# Patient Record
Sex: Male | Born: 1975
Health system: Southern US, Community
[De-identification: ages and names within clinical notes are randomized; demographics above are authoritative.]

## PROBLEM LIST (undated history)

## (undated) DIAGNOSIS — C3490 Malignant neoplasm of unspecified part of unspecified bronchus or lung: Secondary | ICD-10-CM

## (undated) DIAGNOSIS — IMO0001 Reserved for inherently not codable concepts without codable children: Secondary | ICD-10-CM

## (undated) DIAGNOSIS — M5441 Lumbago with sciatica, right side: Secondary | ICD-10-CM

## (undated) DIAGNOSIS — M5126 Other intervertebral disc displacement, lumbar region: Secondary | ICD-10-CM

## (undated) DIAGNOSIS — IMO0002 Reserved for concepts with insufficient information to code with codable children: Secondary | ICD-10-CM

## (undated) HISTORY — DX: Malignant neoplasm of unspecified part of unspecified bronchus or lung: C34.90

## (undated) HISTORY — DX: Reserved for concepts with insufficient information to code with codable children: IMO0002

## (undated) HISTORY — DX: Reserved for inherently not codable concepts without codable children: IMO0001

---

## 2005-05-14 HISTORY — PX: VASECTOMY: SHX75

## 2015-10-09 ENCOUNTER — Inpatient Hospital Stay (HOSPITAL_COMMUNITY)
Admission: EM | Admit: 2015-10-09 | Discharge: 2015-10-17 | DRG: 477 | Disposition: A | Discharge status: Home Health | Payer: BC Managed Care – PPO | Attending: Internal Medicine | Admitting: Internal Medicine

## 2015-10-09 ENCOUNTER — Encounter (HOSPITAL_COMMUNITY): Payer: Self-pay | Admitting: Emergency Medicine

## 2015-10-09 ENCOUNTER — Emergency Department (HOSPITAL_COMMUNITY): Payer: BC Managed Care – PPO

## 2015-10-09 DIAGNOSIS — M8448XA Pathological fracture, other site, initial encounter for fracture: Secondary | ICD-10-CM | POA: Diagnosis present

## 2015-10-09 DIAGNOSIS — Z801 Family history of malignant neoplasm of trachea, bronchus and lung: Secondary | ICD-10-CM

## 2015-10-09 DIAGNOSIS — M4327 Fusion of spine, lumbosacral region: Secondary | ICD-10-CM | POA: Diagnosis present

## 2015-10-09 DIAGNOSIS — C801 Malignant (primary) neoplasm, unspecified: Secondary | ICD-10-CM | POA: Insufficient documentation

## 2015-10-09 DIAGNOSIS — R918 Other nonspecific abnormal finding of lung field: Secondary | ICD-10-CM | POA: Diagnosis not present

## 2015-10-09 DIAGNOSIS — C7951 Secondary malignant neoplasm of bone: Secondary | ICD-10-CM | POA: Diagnosis not present

## 2015-10-09 DIAGNOSIS — M899 Disorder of bone, unspecified: Secondary | ICD-10-CM

## 2015-10-09 DIAGNOSIS — Z87891 Personal history of nicotine dependence: Secondary | ICD-10-CM

## 2015-10-09 DIAGNOSIS — IMO0002 Reserved for concepts with insufficient information to code with codable children: Secondary | ICD-10-CM

## 2015-10-09 DIAGNOSIS — M549 Dorsalgia, unspecified: Secondary | ICD-10-CM | POA: Diagnosis present

## 2015-10-09 DIAGNOSIS — C3490 Malignant neoplasm of unspecified part of unspecified bronchus or lung: Secondary | ICD-10-CM | POA: Diagnosis present

## 2015-10-09 DIAGNOSIS — R52 Pain, unspecified: Secondary | ICD-10-CM

## 2015-10-09 DIAGNOSIS — C7949 Secondary malignant neoplasm of other parts of nervous system: Secondary | ICD-10-CM | POA: Insufficient documentation

## 2015-10-09 DIAGNOSIS — M25552 Pain in left hip: Secondary | ICD-10-CM | POA: Diagnosis not present

## 2015-10-09 DIAGNOSIS — Z807 Family history of other malignant neoplasms of lymphoid, hematopoietic and related tissues: Secondary | ICD-10-CM

## 2015-10-09 DIAGNOSIS — M5146 Schmorl's nodes, lumbar region: Secondary | ICD-10-CM | POA: Diagnosis present

## 2015-10-09 DIAGNOSIS — M5441 Lumbago with sciatica, right side: Secondary | ICD-10-CM | POA: Diagnosis present

## 2015-10-09 DIAGNOSIS — D72829 Elevated white blood cell count, unspecified: Secondary | ICD-10-CM | POA: Diagnosis present

## 2015-10-09 DIAGNOSIS — I2699 Other pulmonary embolism without acute cor pulmonale: Secondary | ICD-10-CM | POA: Insufficient documentation

## 2015-10-09 DIAGNOSIS — D492 Neoplasm of unspecified behavior of bone, soft tissue, and skin: Secondary | ICD-10-CM | POA: Diagnosis present

## 2015-10-09 DIAGNOSIS — S32000A Wedge compression fracture of unspecified lumbar vertebra, initial encounter for closed fracture: Secondary | ICD-10-CM | POA: Insufficient documentation

## 2015-10-09 DIAGNOSIS — M5126 Other intervertebral disc displacement, lumbar region: Secondary | ICD-10-CM | POA: Diagnosis present

## 2015-10-09 DIAGNOSIS — C7800 Secondary malignant neoplasm of unspecified lung: Secondary | ICD-10-CM | POA: Diagnosis present

## 2015-10-09 DIAGNOSIS — K5900 Constipation, unspecified: Secondary | ICD-10-CM | POA: Diagnosis not present

## 2015-10-09 DIAGNOSIS — I82401 Acute embolism and thrombosis of unspecified deep veins of right lower extremity: Secondary | ICD-10-CM | POA: Diagnosis not present

## 2015-10-09 DIAGNOSIS — K5909 Other constipation: Secondary | ICD-10-CM

## 2015-10-09 DIAGNOSIS — Z880 Allergy status to penicillin: Secondary | ICD-10-CM

## 2015-10-09 DIAGNOSIS — J189 Pneumonia, unspecified organism: Secondary | ICD-10-CM | POA: Diagnosis present

## 2015-10-09 DIAGNOSIS — R229 Localized swelling, mass and lump, unspecified: Secondary | ICD-10-CM

## 2015-10-09 HISTORY — DX: Lumbago with sciatica, right side: M54.41

## 2015-10-09 HISTORY — DX: Other intervertebral disc displacement, lumbar region: M51.26

## 2015-10-09 LAB — CBC WITH DIFFERENTIAL/PLATELET
Basophils Absolute: 0.1 10*3/uL (ref 0.0–0.1)
Basophils Relative: 0 %
EOS ABS: 0.6 10*3/uL (ref 0.0–0.7)
Eosinophils Relative: 4 %
HCT: 40.9 % (ref 39.0–52.0)
HEMOGLOBIN: 14.9 g/dL (ref 13.0–17.0)
LYMPHS ABS: 1.7 10*3/uL (ref 0.7–4.0)
LYMPHS PCT: 11 %
MCH: 30.6 pg (ref 26.0–34.0)
MCHC: 36.4 g/dL — AB (ref 30.0–36.0)
MCV: 84 fL (ref 78.0–100.0)
MONOS PCT: 5 %
Monocytes Absolute: 0.8 10*3/uL (ref 0.1–1.0)
NEUTROS PCT: 80 %
Neutro Abs: 11.8 10*3/uL — ABNORMAL HIGH (ref 1.7–7.7)
Platelets: 241 10*3/uL (ref 150–400)
RBC: 4.87 MIL/uL (ref 4.22–5.81)
RDW: 11.8 % (ref 11.5–15.5)
WBC: 15 10*3/uL — AB (ref 4.0–10.5)

## 2015-10-09 LAB — I-STAT CHEM 8, ED
BUN: 33 mg/dL — ABNORMAL HIGH (ref 6–20)
Calcium, Ion: 1.1 mmol/L — ABNORMAL LOW (ref 1.12–1.23)
Chloride: 99 mmol/L — ABNORMAL LOW (ref 101–111)
Creatinine, Ser: 1.1 mg/dL (ref 0.61–1.24)
Glucose, Bld: 108 mg/dL — ABNORMAL HIGH (ref 65–99)
HEMATOCRIT: 43 % (ref 39.0–52.0)
HEMOGLOBIN: 14.6 g/dL (ref 13.0–17.0)
POTASSIUM: 4.3 mmol/L (ref 3.5–5.1)
SODIUM: 140 mmol/L (ref 135–145)
TCO2: 31 mmol/L (ref 0–100)

## 2015-10-09 LAB — COMPREHENSIVE METABOLIC PANEL
ALT: 31 U/L (ref 17–63)
AST: 33 U/L (ref 15–41)
Albumin: 4 g/dL (ref 3.5–5.0)
Alkaline Phosphatase: 200 U/L — ABNORMAL HIGH (ref 38–126)
Anion gap: 9 (ref 5–15)
BUN: 28 mg/dL — AB (ref 6–20)
CHLORIDE: 101 mmol/L (ref 101–111)
CO2: 30 mmol/L (ref 22–32)
CREATININE: 1.04 mg/dL (ref 0.61–1.24)
Calcium: 9.7 mg/dL (ref 8.9–10.3)
GFR calc Af Amer: >60 mL/min (ref >60)
Glucose, Bld: 137 mg/dL — ABNORMAL HIGH (ref 65–99)
POTASSIUM: 4.3 mmol/L (ref 3.5–5.1)
SODIUM: 140 mmol/L (ref 135–145)
Total Bilirubin: 0.7 mg/dL (ref 0.3–1.2)
Total Protein: 6.9 g/dL (ref 6.5–8.1)

## 2015-10-09 MED ORDER — ENSURE ENLIVE PO LIQD
237.0000 mL | Freq: Two times a day (BID) | ORAL | Status: DC
  Administered 2015-10-10 – 2015-10-17 (×12): 237 mL via ORAL

## 2015-10-09 MED ORDER — HYDROMORPHONE HCL 1 MG/ML IJ SOLN: 1.0000 mg | INTRAMUSCULAR | Status: DC | PRN

## 2015-10-09 MED ORDER — SODIUM CHLORIDE 0.9 % IV SOLN: 250.0000 mL | INTRAVENOUS | Status: DC | PRN

## 2015-10-09 MED ORDER — OXYCODONE HCL 5 MG PO TABS
5.0000 mg | ORAL_TABLET | ORAL | Status: DC | PRN
  Administered 2015-10-09 – 2015-10-11 (×9): 5 mg via ORAL
  Filled 2015-10-09 (×9): qty 1

## 2015-10-09 MED ORDER — ENOXAPARIN SODIUM 40 MG/0.4ML ~~LOC~~ SOLN
40.0000 mg | SUBCUTANEOUS | Status: DC
  Administered 2015-10-09: 40 mg via SUBCUTANEOUS
  Filled 2015-10-09 (×2): qty 0.4

## 2015-10-09 MED ORDER — ACETAMINOPHEN 650 MG RE SUPP: 650.0000 mg | Freq: Four times a day (QID) | RECTAL | Status: DC | PRN

## 2015-10-09 MED ORDER — HYDROMORPHONE HCL 1 MG/ML IJ SOLN
1.0000 mg | Freq: Once | INTRAMUSCULAR | Status: AC
  Administered 2015-10-09: 1 mg via INTRAVENOUS
  Filled 2015-10-09: qty 1

## 2015-10-09 MED ORDER — KETOROLAC TROMETHAMINE 30 MG/ML IJ SOLN
30.0000 mg | Freq: Once | INTRAMUSCULAR | Status: AC
  Administered 2015-10-09: 30 mg via INTRAVENOUS
  Filled 2015-10-09: qty 1

## 2015-10-09 MED ORDER — CYCLOBENZAPRINE HCL 10 MG PO TABS
10.0000 mg | ORAL_TABLET | Freq: Once | ORAL | Status: AC
  Administered 2015-10-09: 10 mg via ORAL
  Filled 2015-10-09: qty 1

## 2015-10-09 MED ORDER — LORATADINE 10 MG PO TABS
10.0000 mg | ORAL_TABLET | Freq: Every evening | ORAL | Status: DC
  Administered 2015-10-10 – 2015-10-16 (×7): 10 mg via ORAL
  Filled 2015-10-09 (×9): qty 1

## 2015-10-09 MED ORDER — ONDANSETRON HCL 4 MG/2ML IJ SOLN: 4.0000 mg | Freq: Four times a day (QID) | INTRAMUSCULAR | Status: DC | PRN

## 2015-10-09 MED ORDER — SODIUM CHLORIDE 0.9% FLUSH
3.0000 mL | Freq: Two times a day (BID) | INTRAVENOUS | Status: DC
  Administered 2015-10-09 – 2015-10-15 (×6): 3 mL via INTRAVENOUS
  Administered 2015-10-16: 10 mL via INTRAVENOUS
  Administered 2015-10-16 – 2015-10-17 (×2): 3 mL via INTRAVENOUS

## 2015-10-09 MED ORDER — ONDANSETRON HCL 4 MG/2ML IJ SOLN: 4.0000 mg | Freq: Three times a day (TID) | INTRAMUSCULAR | Status: DC | PRN

## 2015-10-09 MED ORDER — SODIUM CHLORIDE 0.9% FLUSH
3.0000 mL | INTRAVENOUS | Status: DC | PRN
Start: 2015-10-09 — End: 2015-10-17

## 2015-10-09 MED ORDER — ACETAMINOPHEN 325 MG PO TABS: 650.0000 mg | ORAL_TABLET | Freq: Four times a day (QID) | ORAL | Status: DC | PRN

## 2015-10-09 MED ORDER — ONDANSETRON HCL 4 MG PO TABS: 4.0000 mg | ORAL_TABLET | Freq: Four times a day (QID) | ORAL | Status: DC | PRN

## 2015-10-09 MED ORDER — HYDROMORPHONE HCL 1 MG/ML IJ SOLN
0.5000 mg | INTRAMUSCULAR | Status: DC | PRN
  Administered 2015-10-09: 1 mg via INTRAVENOUS
  Administered 2015-10-10 (×2): 0.5 mg via INTRAVENOUS
  Administered 2015-10-11 – 2015-10-16 (×9): 1 mg via INTRAVENOUS
  Filled 2015-10-09 (×13): qty 1

## 2015-10-09 MED ORDER — DEXAMETHASONE SODIUM PHOSPHATE 10 MG/ML IJ SOLN
10.0000 mg | Freq: Once | INTRAMUSCULAR | Status: AC
  Administered 2015-10-09: 10 mg via INTRAVENOUS
  Filled 2015-10-09: qty 1

## 2015-10-09 MED ORDER — DICLOFENAC SODIUM 50 MG PO TBEC
50.0000 mg | DELAYED_RELEASE_TABLET | Freq: Two times a day (BID) | ORAL | Status: DC
  Administered 2015-10-09 – 2015-10-11 (×4): 50 mg via ORAL
  Filled 2015-10-09 (×8): qty 1

## 2015-10-09 MED ORDER — METHOCARBAMOL 1000 MG/10ML IJ SOLN
1000.0000 mg | Freq: Once | INTRAVENOUS | Status: AC
  Administered 2015-10-09: 1000 mg via INTRAVENOUS
  Filled 2015-10-09: qty 10

## 2015-10-09 NOTE — ED Notes (Signed)
Bed: EH20 Expected date:  Expected time:  Means of arrival:  Comments: EMS- 40yo back pain

## 2015-10-09 NOTE — H&P (Signed)
[facility] Admission History and Physical       Taylor Sanchez CEF:[id number] DOB: 1975/06/05 DOA: 10/09/2015  Referring physician: EDP PCP: No primary care provider on file.  Specialists:   Chief Complaint: Severe Low Back Pain  HPI: Taylor Sanchez is a 40 y.o. male who presents to the ED with complaints of severe 10/10 Low Back Pain worsening over the past 24 hours, with muscles spasms, and pain radiating down his Right leg.    He had an MRI performed on 05/25 ( found on the Care Anywhere ordered through [facility]), of which he was to follow up with his PCP on Tuesday 05/30 to go over the results.    He was prescribed medications which had not relieved his pain.  THe EDP discussed those results with patient and his mother who is at the bedside.  The MRI reveals evidence of Lumbar metastatic Disease affecting the L4-L5 and L-5 -S-1).    A Chest X-ray was performed and revealed findings suggestive of a Bronchogenic Carcinoma of the RUL.     CT scan of the Chest and ABD were recommended, and he was referred for observation.       He reports that he has had pain since January which occurred after working out at [employer] and lifting heavy objects, and later the pain was exacerbated by rough housing with one of his friends.    He denies any loss of bowel or bladder function.    He reports not being able to stand and walk due to pain and spasms.   He was evaluated in the ED and medicated with IV Toradol followed by doses of IV Robaxin, IV Dexamethasone, and IV Dilaudid which gave him mild relief.       Review of Systems:  Constitutional: No Weight Loss, No Weight Gain, Night Sweats, Fevers, Chills, Dizziness, Light Headedness, Fatigue, or Generalized Weakness HEENT: No Headaches, Difficulty Swallowing,Tooth/Dental Problems,Sore Throat,  No Sneezing, Rhinitis, Ear Ache, Nasal Congestion, or Post Nasal Drip,  Cardio-vascular:  No Chest pain, Orthopnea, PND, Edema in Lower Extremities,  Anasarca, Dizziness, Palpitations  Resp: No Dyspnea, No DOE, No Productive Cough, No Non-Productive Cough, No Hemoptysis, No Wheezing.    GI: No Heartburn, Indigestion, Abdominal Pain, Nausea, Vomiting, Diarrhea, Constipation, Hematemesis, Hematochezia, Melena, Change in Bowel Habits,  Loss of Appetite  GU: No Dysuria, No Change in Color of Urine, No Urgency or Urinary Frequency, No Flank pain.  Musculoskeletal: No Joint Pain or Swelling, No Decreased Range of Motion, +Back Pain.  Neurologic: No Syncope, No Seizures, Muscle Weakness, Paresthesia, Vision Disturbance or Loss, No Diplopia, No Vertigo, No Difficulty Walking,  Skin: No Rash or Lesions. Psych: No Change in Mood or Affect, No Depression or Anxiety, No Memory loss, No Confusion, or Hallucinations   Past Medical History  Diagnosis Date  . Herniated lumbar disc without myelopathy      Past Surgical History  Procedure Laterality Date  . Vasectomy  2007      Prior to Admission medications   Medication Sig Start Date End Date Taking? Authorizing Provider  cyclobenzaprine (FLEXERIL) 10 MG tablet Take 10 mg by mouth at bedtime.  10/03/15  Yes Historical Provider, MD  diclofenac (VOLTAREN) 50 MG EC tablet Take 50 mg by mouth 2 (two) times daily. 10/07/15  Yes Historical Provider, MD  levocetirizine (XYZAL) 5 MG tablet Take 5 mg by mouth every evening.  09/19/15  Yes Historical Provider, MD  Multiple Vitamins-Minerals (MULTIVITAMIN ADULT PO) Take 1 capsule by  mouth daily.   Yes Historical Provider, MD  traMADol (ULTRAM) 50 MG tablet Take 50 mg by mouth 2 (two) times daily.  10/03/15  Yes Historical Provider, MD     Allergies  Allergen Reactions  . Bee Venom   . Penicillins Hives    Has patient had a PCN reaction causing immediate rash, facial/tongue/throat swelling, SOB or lightheadedness with hypotension: No Has patient had a PCN reaction causing severe rash involving mucus membranes or skin necrosis: No Has patient had a PCN  reaction that required hospitalization No Has patient had a PCN reaction occurring within the last 10 years: No If all of the above answers are "NO", then may proceed with Cephalosporin use.   . 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 His smoking use included Cigarettes. He has never used smokeless tobacco. He reports that he drinks alcohol. He reports that he does not use illicit drugs.    No family history on file.     Physical Exam:  GEN:  Pleasant Well Nourished and Well Developed  40 y.o. Caucasian male examined and in no acute distress; cooperative with exam Filed Vitals:   10/09/15 1825 10/09/15 1828 10/09/15 2034  BP: 131/82  131/86  Pulse: 77  96  Temp: 98.4 F (36.9 C)    TempSrc: Oral    Resp: 18  18  Height:  _0  (1.854 m)   Weight:  81.647 kg (180 lb)   SpO2: 92%  95%   Blood pressure 131/86, pulse 96, temperature 98.4 F (36.9 C), temperature source Oral, resp. rate 18, height _1  (1.854 m), weight 81.647 kg (180 lb), SpO2 95 %. PSYCH: He is alert and oriented x4; does not appear anxious does not appear depressed; affect is normal HEENT: Normocephalic and Atraumatic, Mucous membranes pink; PERRLA; EOM intact; Fundi:  Benign;  No scleral icterus, Nares: Patent, Oropharynx: Clear, Fair Dentition,    Neck:  FROM, No Cervical Lymphadenopathy nor Thyromegaly or Carotid Bruit; No JVD; Breasts:: Not examined CHEST WALL: No tenderness CHEST: Normal respiration, clear to auscultation bilaterally HEART: Regular rate and rhythm; no murmurs rubs or gallops BACK: No kyphosis or scoliosis; No CVA tenderness ABDOMEN: Positive Bowel Sounds,  Soft Non-Tender, No Rebound or Guarding; No Masses, No Organomegaly Rectal Exam: Not done EXTREMITIES: No Cyanosis, Clubbing, or Edema; No Ulcerations. Genitalia: not examined PULSES:  2+ and symmetric SKIN: Normal hydration no rash or ulceration CNS:  Alert and Oriented x 4, No Focal Deficits Vascular: pulses palpable throughout    Labs on Admission:  Basic Metabolic Panel:  Recent Labs Lab 10/09/15 1914 10/09/15 2000  NA 140 140  K 4.3 4.3  CL 99* 101  CO2  --  30  GLUCOSE 108* 137*  BUN 33* 28*  CREATININE 1.10 1.04  CALCIUM  --  9.7   Liver Function Tests:  Recent Labs Lab 10/09/15 2000  AST 33  ALT 31  ALKPHOS 200*  BILITOT 0.7  PROT 6.9  ALBUMIN 4.0   No results for input(s): LIPASE, AMYLASE in the last 168 hours. No results for input(s): AMMONIA in the last 168 hours. CBC:  Recent Labs Lab 10/09/15 1914 10/09/15 2000  WBC  --  15.0*  NEUTROABS  --  11.8*  HGB 14.6 14.9  HCT 43.0 40.9  MCV  --  84.0  PLT  --  241   Cardiac Enzymes: No results for input(s): CKTOTAL, CKMB, CKMBINDEX, TROPONINI in the last 168 hours.  BNP (last 3 results) No results for input(s): BNP in the last 8760 hours.  ProBNP (last 3 results) No results for input(s): PROBNP in the last 8760 hours.  CBG: No results for input(s): GLUCAP in the last 168 hours.  Radiological Exams on Admission: Dg Chest 2 View  10/09/2015  CLINICAL DATA:  39 year old male with new diagnosis of metastatic disease to the spine. Smoker. No known primary malignancy. EXAM: CHEST  2 VIEW COMPARISON:  No priors. FINDINGS: Soft tissue fullness near the apex of the right upper lobe. Small nodular densities also project over the left upper lobe and right base. None of these demonstrate definitive correlate opacities on the lateral projections. No pleural effusions. No pneumothorax. No evidence of pulmonary edema. Heart size is normal. Fullness in the left hilar region. Soft tissue fullness also noted throughout the paratracheal soft tissues in the upper mediastinum. IMPRESSION: 1. Soft tissue fullness in the right upper lobe near the apex, and additional nodular opacities throughout the  lungs bilaterally. Given the patient's history of recently diagnosed metastatic disease to the spine, further evaluation with contrast enhanced chest CT is recommended in the near future to better evaluate these findings, as primary bronchogenic neoplasm in the right upper lobe is not excluded. 2. Contrast-enhanced chest CT will also be better able to evaluate the left hilar bilateral paratracheal fullness. Electronically Signed   By: Vinnie Langton M.D.   On: 10/09/2015 20:31       Assessment/Plan:   40 y.o. male with  Principal Problem:    Intractable back pain - Abnl MRI on 05/25 due to Metastatic Changes   Pain Control     IV Steroid    IV Dilaudid    IV Robaxin Rx   Initiate Cancer Workup      Active Problems:   Lung Mass- on Chest X-Ray    IR Consult in AM       Right-sided low back pain with right-sided sciatica due to Met Disease     Pain Control    Leukocytosis- Stress Rxn, vs Acute phase Reactant    Monitor Trend    DVT Prophylaxis    Lovenox   Code Status:     FULL CODE      Family Communication:   Mother at Bedside       Disposition Plan:    Observation Status        Time spent:  69 Minutes      Theressa Millard [facility] Pager [phone]   If 7AM -7PM Please Contact the Day Rounding Team MD for [facility]  If 7PM-7AM, Please Contact Night-Floor Coverage  [email or web address] Password [facility] 10/09/2015, 8:41 PM     ADDENDUM:   Patient was seen and examined on 10/09/2015

## 2015-10-09 NOTE — ED Notes (Signed)
Pt comes by EMS with complaints of severe back pain. Pt is from [place] and is visiting family. He recently had a MRI confirming herniated discs (L4-L5 & L5-S1).  They referred him to a doctor that could not see him until this coming Tuesday.  Pain has become intolerable. 200 mcg of fentanyl given in field.

## 2015-10-09 NOTE — ED Provider Notes (Addendum)
CSN: [id number]     Arrival date & time 10/09/15  1814 History   First MD Initiated Contact with Patient 10/09/15 1823     Chief Complaint  Patient presents with  . Back Pain     (Consider location/radiation/quality/duration/timing/severity/associated sxs/prior Treatment) HPI   Patient with hx 5 months of low back pain p/w uncontrolled pain.  States in 06-07-2022 he was dead lifting weights and suddenly developed pain in his low back.  He had a second injury in April in which a friend greeted him by jumping on him.  He has been to his doctor several times and has had xrays suggestive of disc problems and then MRI this week.  He has an appointment on Tuesday to discuss results (which he does not have yet).  Came to [facility] to stay with his mother because the pain was uncontrolled.  He is walking with a walker but today was unable to stand.  Has taken diclofenac, flexeril (qHS), and tramadol without relief.  The pain is constant and severe in his lumbar spine, occasionally radiates into his right leg.  Denies fevers, abdominal pain, urinary symptoms, weakness or numbness in his legs.    Father had multiple myeloma.    Past Medical History  Diagnosis Date  . Herniated lumbar disc without myelopathy    Past Surgical History  Procedure Laterality Date  . Vasectomy  2007   No family history on file. Social History  Substance Use Topics  . Smoking status: Former Smoker    Types: Cigarettes    Quit date: 10/09/2011  . Smokeless tobacco: Never Used  . Alcohol Use: Yes     Comment: Socially    Review of Systems  All other systems reviewed and are negative.     Allergies  Bee venom; Penicillins; and Prednisone  Home Medications   Prior to Admission medications   Not on File   BP 131/82 mmHg  Pulse 77  Temp(Src) 98.4 F (36.9 C) (Oral)  Resp 18  Ht _0  (1.854 m)  Wt 81.647 kg  BMI 23.75 kg/m2  SpO2 92% Physical Exam  Constitutional: He appears well-developed and  well-nourished. No distress.  HENT:  Head: Normocephalic and atraumatic.  Eyes: Conjunctivae are normal.  Neck: Neck supple.  Cardiovascular: Normal rate and regular rhythm.   Pulmonary/Chest: Effort normal and breath sounds normal.  Abdominal: Soft. He exhibits no distension. There is no tenderness. There is no rebound and no guarding.  Musculoskeletal: He exhibits no edema.  Tenderness over lumbar spine.  No thoracic or cervical spine tenderness.  No overlying skin changes.   Lower extremities:  Strength 5/5, sensation intact, distal pulses intact.     Neurological: He is alert. He exhibits normal muscle tone.  Skin: He is not diaphoretic.  Nursing note and vitals reviewed.   ED Course  Procedures (including critical care time) Labs Review Labs Reviewed  CBC WITH DIFFERENTIAL/PLATELET - Abnormal; Notable for the following:    WBC 15.0 (*)    MCHC 36.4 (*)    Neutro Abs 11.8 (*)    All other components within normal limits  I-STAT CHEM 8, ED - Abnormal; Notable for the following:    Chloride 99 (*)    BUN 33 (*)    Glucose, Bld 108 (*)    Calcium, Ion 1.10 (*)    All other components within normal limits  COMPREHENSIVE METABOLIC PANEL    Imaging Review No results found. I have personally reviewed and evaluated these images  and lab results as part of my medical decision-making.   EKG Interpretation None       CARE EVERYWHERE:  MRI report available from 10/06/15  IMPRESSION: 1. Signal abnormality involving lumbar vertebra and sacrum as well as posterior elements that overall appears concerning for metastatic malignancy. 2. L4 vertebra appears expanded posteriorly towards the spinal canal likely secondary to tumor infiltration. There is loss of height of L4 vertebral concerning for pathologic compression fracture. There is depression of superior endplates of L3 and  inferior endplate of L3, which could be related to Schmorls nodes, although pathologic compression  fractures at these sites not excluded. 3. Abnormal soft tissue on the left at L3-L4 concerning for areas of a tumor infiltration with additional abnormal soft tissue surrounding the posterior elements of lower lumbar spine concerning for areas of tumor infiltration. 4. Transitional anatomy suspected with likely sacralization of L5 vertebra as described in prior plain film report.  Note: Multiple attempts at calling preliminary report to referring clinicians office unsuccessful. Preliminary report will be called by radiology technologist per my request.   Bethanie Dicker, MD   MDM   Final diagnoses:  Lumbar spine tumor  Compression fracture of lumbar vertebra, closed, initial encounter ([place])  Right-sided low back pain with right-sided sciatica    Afebrile nontoxic previously healthy patient with 5 months of low back pain gradually worsening to the point that he has been using a walker and today was unable to walk.  No s/s concerning for acute cord syndrome.  Pt had MRI performed 3 days ago, ordered by PCP and he did not yet know the results.  Unfortunately the MRI shows e/o metastatic disease at L4 with associated compression fracture, also with some extension into the L3-L4 space. Pt lives in [place] and mother lives in [place].  Has PCP appointment on Tuesday at [facility], Dr Dara Lords.  Pt continues to have debilitating pain with IV narcotics and other medications.  I have discussed pt with Dr Arnoldo Morale who accepts patient for admission.     Clayton Bibles, PA-C 10/09/15 2043  Dorie Rank, MD 10/09/15 2110  Clayton Bibles, PA-C 10/11/15 [place], MD 10/12/15 1005

## 2015-10-10 ENCOUNTER — Observation Stay: Payer: Self-pay

## 2015-10-10 ENCOUNTER — Observation Stay (HOSPITAL_COMMUNITY): Payer: BC Managed Care – PPO

## 2015-10-10 ENCOUNTER — Encounter (HOSPITAL_COMMUNITY): Payer: Self-pay | Admitting: Radiology

## 2015-10-10 DIAGNOSIS — C7951 Secondary malignant neoplasm of bone: Secondary | ICD-10-CM | POA: Diagnosis present

## 2015-10-10 DIAGNOSIS — C7949 Secondary malignant neoplasm of other parts of nervous system: Secondary | ICD-10-CM | POA: Diagnosis present

## 2015-10-10 DIAGNOSIS — M549 Dorsalgia, unspecified: Secondary | ICD-10-CM | POA: Diagnosis present

## 2015-10-10 DIAGNOSIS — C7800 Secondary malignant neoplasm of unspecified lung: Secondary | ICD-10-CM | POA: Diagnosis present

## 2015-10-10 DIAGNOSIS — I2699 Other pulmonary embolism without acute cor pulmonale: Secondary | ICD-10-CM | POA: Diagnosis present

## 2015-10-10 DIAGNOSIS — Z880 Allergy status to penicillin: Secondary | ICD-10-CM | POA: Diagnosis not present

## 2015-10-10 DIAGNOSIS — Z812 Family history of tobacco abuse and dependence: Secondary | ICD-10-CM

## 2015-10-10 DIAGNOSIS — C3490 Malignant neoplasm of unspecified part of unspecified bronchus or lung: Secondary | ICD-10-CM | POA: Diagnosis present

## 2015-10-10 DIAGNOSIS — M8448XA Pathological fracture, other site, initial encounter for fracture: Secondary | ICD-10-CM | POA: Diagnosis present

## 2015-10-10 DIAGNOSIS — M4327 Fusion of spine, lumbosacral region: Secondary | ICD-10-CM | POA: Diagnosis present

## 2015-10-10 DIAGNOSIS — Z807 Family history of other malignant neoplasms of lymphoid, hematopoietic and related tissues: Secondary | ICD-10-CM | POA: Diagnosis not present

## 2015-10-10 DIAGNOSIS — Z87891 Personal history of nicotine dependence: Secondary | ICD-10-CM

## 2015-10-10 DIAGNOSIS — S32000A Wedge compression fracture of unspecified lumbar vertebra, initial encounter for closed fracture: Secondary | ICD-10-CM | POA: Diagnosis not present

## 2015-10-10 DIAGNOSIS — M5146 Schmorl's nodes, lumbar region: Secondary | ICD-10-CM | POA: Diagnosis present

## 2015-10-10 DIAGNOSIS — M5441 Lumbago with sciatica, right side: Secondary | ICD-10-CM | POA: Diagnosis present

## 2015-10-10 DIAGNOSIS — M5126 Other intervertebral disc displacement, lumbar region: Secondary | ICD-10-CM | POA: Diagnosis present

## 2015-10-10 DIAGNOSIS — Z801 Family history of malignant neoplasm of trachea, bronchus and lung: Secondary | ICD-10-CM | POA: Diagnosis not present

## 2015-10-10 DIAGNOSIS — J189 Pneumonia, unspecified organism: Secondary | ICD-10-CM | POA: Diagnosis present

## 2015-10-10 DIAGNOSIS — M25552 Pain in left hip: Secondary | ICD-10-CM | POA: Diagnosis not present

## 2015-10-10 DIAGNOSIS — I82401 Acute embolism and thrombosis of unspecified deep veins of right lower extremity: Secondary | ICD-10-CM | POA: Diagnosis not present

## 2015-10-10 DIAGNOSIS — R918 Other nonspecific abnormal finding of lung field: Secondary | ICD-10-CM | POA: Diagnosis not present

## 2015-10-10 DIAGNOSIS — K5900 Constipation, unspecified: Secondary | ICD-10-CM | POA: Diagnosis not present

## 2015-10-10 DIAGNOSIS — C801 Malignant (primary) neoplasm, unspecified: Secondary | ICD-10-CM | POA: Diagnosis present

## 2015-10-10 LAB — BASIC METABOLIC PANEL
Anion gap: 10 (ref 5–15)
BUN: 32 mg/dL — AB (ref 6–20)
CHLORIDE: 102 mmol/L (ref 101–111)
CO2: 28 mmol/L (ref 22–32)
Calcium: 9.5 mg/dL (ref 8.9–10.3)
Creatinine, Ser: 1.13 mg/dL (ref 0.61–1.24)
GFR calc Af Amer: >60 mL/min (ref >60)
GFR calc non Af Amer: >60 mL/min (ref >60)
GLUCOSE: 171 mg/dL — AB (ref 65–99)
POTASSIUM: 4.4 mmol/L (ref 3.5–5.1)
Sodium: 140 mmol/L (ref 135–145)

## 2015-10-10 LAB — APTT: aPTT: 30 seconds (ref 24–37)

## 2015-10-10 LAB — CBC
HEMATOCRIT: 39.5 % (ref 39.0–52.0)
HEMOGLOBIN: 14.3 g/dL (ref 13.0–17.0)
MCH: 30.3 pg (ref 26.0–34.0)
MCHC: 36.2 g/dL — AB (ref 30.0–36.0)
MCV: 83.7 fL (ref 78.0–100.0)
Platelets: 247 10*3/uL (ref 150–400)
RBC: 4.72 MIL/uL (ref 4.22–5.81)
RDW: 11.7 % (ref 11.5–15.5)
WBC: 11.8 10*3/uL — ABNORMAL HIGH (ref 4.0–10.5)

## 2015-10-10 LAB — HEPARIN LEVEL (UNFRACTIONATED): HEPARIN UNFRACTIONATED: 0.72 [IU]/mL — AB (ref 0.30–0.70)

## 2015-10-10 LAB — PROTIME-INR
INR: 1.18 (ref 0.00–1.49)
Prothrombin Time: 15.2 seconds (ref 11.6–15.2)

## 2015-10-10 MED ORDER — LEVOFLOXACIN IN D5W 750 MG/150ML IV SOLN
750.0000 mg | INTRAVENOUS | Status: DC
  Administered 2015-10-10 – 2015-10-12 (×3): 750 mg via INTRAVENOUS
  Filled 2015-10-10 (×4): qty 150

## 2015-10-10 MED ORDER — METHOCARBAMOL 500 MG PO TABS
500.0000 mg | ORAL_TABLET | Freq: Four times a day (QID) | ORAL | Status: DC | PRN
  Administered 2015-10-10 – 2015-10-14 (×8): 500 mg via ORAL
  Filled 2015-10-10 (×8): qty 1

## 2015-10-10 MED ORDER — SODIUM CHLORIDE 0.9 % IV SOLN
INTRAVENOUS | Status: DC
  Administered 2015-10-10 – 2015-10-13 (×8): via INTRAVENOUS
  Administered 2015-10-14: 1000 mL via INTRAVENOUS

## 2015-10-10 MED ORDER — IOPAMIDOL (ISOVUE-300) INJECTION 61%
100.0000 mL | Freq: Once | INTRAVENOUS | Status: AC | PRN
  Administered 2015-10-10: 100 mL via INTRAVENOUS

## 2015-10-10 MED ORDER — DIATRIZOATE MEGLUMINE & SODIUM 66-10 % PO SOLN
30.0000 mL | Freq: Once | ORAL | Status: AC
  Administered 2015-10-10: 30 mL via ORAL
  Filled 2015-10-10: qty 30

## 2015-10-10 MED ORDER — HEPARIN BOLUS VIA INFUSION
2500.0000 [IU] | Freq: Once | INTRAVENOUS | Status: AC
  Administered 2015-10-10: 2500 [IU] via INTRAVENOUS
  Filled 2015-10-10: qty 2500

## 2015-10-10 MED ORDER — HEPARIN (PORCINE) IN NACL 100-0.45 UNIT/ML-% IJ SOLN
1500.0000 [IU]/h | INTRAMUSCULAR | Status: DC
  Administered 2015-10-10: 1600 [IU]/h via INTRAVENOUS
  Administered 2015-10-11: 1500 [IU]/h via INTRAVENOUS
  Filled 2015-10-10 (×2): qty 250

## 2015-10-10 NOTE — Progress Notes (Signed)
PROGRESS NOTE    Taylor Sanchez  JXB:[id number] DOB: 09-03-1975 DOA: 10/09/2015 PCP: Rob Hickman, family practice.    Brief Narrative: Taylor Sanchez is a 40 y.o. male who presents to the ED with complaints of severe 10/10 Low Back Pain worsening over the past 24 hours, with muscles spasms, and pain radiating down his Right leg. He had an MRI performed on 05/25 ( found on the Care Anywhere ordered through [facility]), of which he was to follow up with his PCP on Tuesday 05/30 to go over the results. He was prescribed medications which had not relieved his pain. THe EDP discussed those results with patient and his mother who is at the bedside. The MRI reveals evidence of Lumbar metastatic Disease affecting the L4-L5 and L-5 -S-1). A Chest X-ray was performed and revealed findings suggestive of a Bronchogenic Carcinoma of the RUL. CT scan of the Chest and ABD were recommended, and he was referred for observation.    He reports that he has had pain since January which occurred after working out at [employer] and lifting heavy objects, and later the pain was exacerbated by rough housing with one of his friends. He denies any loss of bowel or bladder function. He reports not being able to stand and walk due to pain and spasms. He was evaluated in the ED and medicated with IV Toradol followed by doses of IV Robaxin, IV Dexamethasone, and IV Dilaudid which gave him mild relief.    Assessment & Plan:   Principal Problem:   Intractable back pain Active Problems:   Lumbar spine tumor   Right-sided low back pain with right-sided sciatica   Lung mass   Leukocytosis  1-Severe Back pain:  Patient had MRI 10-06-2015 done outside facility: Signal abnormality involving lumbar vertebra and sacrum as well as posterior elements that overall appears concerning for metastatic malignancy. 2. L4 vertebra appears expanded posteriorly towards the spinal canal likely secondary to tumor  infiltration. There is loss of height of L4 vertebral concerning for pathologic compression fracture. There is depression of superior endplates of L3 and inferior endplate of L3, which could be related to Schmorls nodes, although pathologic compression fractures at these sites not excluded. 3. Abnormal soft tissue on the left at L3-L4 concerning for areas of a tumor infiltration with additional abnormal soft tissue surrounding the posterior elements of lower lumbar spine concerning for areas of tumor infiltration. 4. Transitional anatomy suspected with likely sacralization of L5 vertebra as described in prior plain film report.  -PRN robaxin, oxycodone.  -Oncologist consulted for further care. Defer steroid to oncologist  -Ct chest and abdomen.  -Denies fever, night sweat.  -family history of malignancy, father had Myeloma, Grandmother lung cancer.   2-Lung Mass; Chest x ray with soft tissue fullness. Plan to proceed with CT chest, abdomen today.  Oncology consulted.   3-Screening for HIV.    DVT prophylaxis: Lovenox.  Code Status: Full code.  Family Communication: Mother at bedside.  Disposition Plan: Remain inpatient for pain controlled and further evaluation of probably malignancy   Consultants:   Dr Benay Spice   Procedures:  none  Antimicrobials:  none   Subjective: Still with back pain, is worse when he try to moves his legs.  Has been using a walker to ambulate. Feels right leg is weaker than left but is because of pain/   Denies fevers, chills, night sweats.   Objective: Filed Vitals:   10/09/15 2157 10/09/15 2208 10/10/15 0222 10/10/15 0559  BP: 131/77  122/74 121/76  Pulse: 80  83 83  Temp: 98.1 F (36.7 C)  97.8 F (36.6 C) 97.9 F (36.6 C)  TempSrc: Oral  Oral Oral  Resp: '20  16 16  '$ Height:  '6\' 1"'$  (1.854 m)    Weight:  82.4 kg (181 lb 10.5 oz)    SpO2: 98%  97% 97%    Intake/Output Summary (Last 24 hours) at 10/10/15 0846 Last data filed at 10/10/15  0559  Gross per 24 hour  Intake      0 ml  Output    150 ml  Net   -150 ml   Filed Weights   10/09/15 1828 10/09/15 2208  Weight: 81.647 kg (180 lb) 82.4 kg (181 lb 10.5 oz)    Examination:  General exam: Appears calm and comfortable  Respiratory system: Clear to auscultation. Respiratory effort normal. Cardiovascular system: S1 & S2 heard, RRR. No JVD, murmurs, rubs, gallops or clicks. No pedal edema. Gastrointestinal system: Abdomen is nondistended, soft and nontender. No organomegaly or masses felt. Normal bowel sounds heard. Central nervous system: Alert and oriented. No focal neurological deficits. Difficulty raising B/L LE due to severe pain.  Extremities: Symmetric 5 x 5 power. Skin: No rashes, lesions or ulcers     Data Reviewed: I have personally reviewed following labs and imaging studies  CBC:  Recent Labs Lab 10/09/15 1914 10/09/15 2000 10/10/15 0340  WBC  --  15.0* 11.8*  NEUTROABS  --  11.8*  --   HGB 14.6 14.9 14.3  HCT 43.0 40.9 39.5  MCV  --  84.0 83.7  PLT  --  241 109   Basic Metabolic Panel:  Recent Labs Lab 10/09/15 1914 10/09/15 2000 10/10/15 0340  NA 140 140 140  K 4.3 4.3 4.4  CL 99* 101 102  CO2  --  30 28  GLUCOSE 108* 137* 171*  BUN 33* 28* 32*  CREATININE 1.10 1.04 1.13  CALCIUM  --  9.7 9.5   GFR: Estimated Creatinine Clearance: 100.2 mL/min (by C-G formula based on Cr of 1.13). Liver Function Tests:  Recent Labs Lab 10/09/15 2000  AST 33  ALT 31  ALKPHOS 200*  BILITOT 0.7  PROT 6.9  ALBUMIN 4.0   No results for input(s): LIPASE, AMYLASE in the last 168 hours. No results for input(s): AMMONIA in the last 168 hours. Coagulation Profile: No results for input(s): INR, PROTIME in the last 168 hours. Cardiac Enzymes: No results for input(s): CKTOTAL, CKMB, CKMBINDEX, TROPONINI in the last 168 hours. BNP (last 3 results) No results for input(s): PROBNP in the last 8760 hours. HbA1C: No results for input(s): HGBA1C  in the last 72 hours. CBG: No results for input(s): GLUCAP in the last 168 hours. Lipid Profile: No results for input(s): CHOL, HDL, LDLCALC, TRIG, CHOLHDL, LDLDIRECT in the last 72 hours. Thyroid Function Tests: No results for input(s): TSH, T4TOTAL, FREET4, T3FREE, THYROIDAB in the last 72 hours. Anemia Panel: No results for input(s): VITAMINB12, FOLATE, FERRITIN, TIBC, IRON, RETICCTPCT in the last 72 hours. Sepsis Labs: No results for input(s): PROCALCITON, LATICACIDVEN in the last 168 hours.  No results found for this or any previous visit (from the past 240 hour(s)).       Radiology Studies: Dg Chest 2 View  10/09/2015  CLINICAL DATA:  40 year old male with new diagnosis of metastatic disease to the spine. Smoker. No known primary malignancy. EXAM: CHEST  2 VIEW COMPARISON:  No priors. FINDINGS: Soft tissue fullness near the apex of  the right upper lobe. Small nodular densities also project over the left upper lobe and right base. None of these demonstrate definitive correlate opacities on the lateral projections. No pleural effusions. No pneumothorax. No evidence of pulmonary edema. Heart size is normal. Fullness in the left hilar region. Soft tissue fullness also noted throughout the paratracheal soft tissues in the upper mediastinum. IMPRESSION: 1. Soft tissue fullness in the right upper lobe near the apex, and additional nodular opacities throughout the lungs bilaterally. Given the patient's history of recently diagnosed metastatic disease to the spine, further evaluation with contrast enhanced chest CT is recommended in the near future to better evaluate these findings, as primary bronchogenic neoplasm in the right upper lobe is not excluded. 2. Contrast-enhanced chest CT will also be better able to evaluate the left hilar bilateral paratracheal fullness. Electronically Signed   By: Vinnie Langton M.D.   On: 10/09/2015 20:31        Scheduled Meds: . diclofenac  50 mg Oral BID    . enoxaparin (LOVENOX) injection  40 mg Subcutaneous Q24H  . feeding supplement (ENSURE ENLIVE)  237 mL Oral BID BM  . loratadine  10 mg Oral QPM  . sodium chloride flush  3 mL Intravenous Q12H   Continuous Infusions: . sodium chloride          Time spent: 35 minutes.     Elmarie Shiley, MD [facility] Pager [phone]  If 7PM-7AM, please contact night-coverage [email or web address] Password [facility] 10/10/2015, 8:46 AM

## 2015-10-10 NOTE — Progress Notes (Signed)
New Hematology/Oncology Consult   Referral MD:Dr. Tyrell Antonio        reason for referral: back pain, MRI lumbar spine consistent with metastases    HPI: Taylor Sanchez reports lower back pain initially noted in January after lifting weights. The pain has become severe over the past month. The pain is worse with sitting or standing. The pain became more intense over the past few days. He reports "spasms "in the lower back radiating into the right leg. He presented to the emergency room yesterday and was admitted. He reports significant improvement in the pain today.  His primary physician ordered an MRI of the lumbar spine at [facility] on 10/06/2015. This was remarkable for signal abnormality at the lumbar vertebra and sacrum concerning for metastatic disease. There is concern for a pathologic L3 and L4 fractures. There is abnormal soft tissue surrounding the posterior elements at the lower lumbar spine.      Past Medical History  Diagnosis Date  . Seasonal allergies        :  Past Surgical History  Procedure Laterality Date  . Vasectomy  2007  :   Current facility-administered medications:  .  0.9 %  sodium chloride infusion, 250 mL, Intravenous, PRN, Theressa Millard, MD .  0.9 %  sodium chloride infusion, , Intravenous, Continuous, Belkys A Regalado, MD .  acetaminophen (TYLENOL) tablet 650 mg, 650 mg, Oral, Q6H PRN **OR** acetaminophen (TYLENOL) suppository 650 mg, 650 mg, Rectal, Q6H PRN, Theressa Millard, MD .  diclofenac (VOLTAREN) EC tablet 50 mg, 50 mg, Oral, BID, Theressa Millard, MD, 50 mg at 10/09/15 2201 .  enoxaparin (LOVENOX) injection 40 mg, 40 mg, Subcutaneous, Q24H, Theressa Millard, MD, 40 mg at 10/09/15 2202 .  feeding supplement (ENSURE ENLIVE) (ENSURE ENLIVE) liquid 237 mL, 237 mL, Oral, BID BM, Harvette C Jenkins, MD .  HYDROmorphone (DILAUDID) injection 0.5-1 mg, 0.5-1 mg, Intravenous, Q3H PRN, Theressa Millard, MD, 0.5 mg at 10/10/15 0731 .   loratadine (CLARITIN) tablet 10 mg, 10 mg, Oral, QPM, Theressa Millard, MD, 10 mg at 10/09/15 2201 .  methocarbamol (ROBAXIN) tablet 500 mg, 500 mg, Oral, Q6H PRN, Belkys A Regalado, MD .  ondansetron (ZOFRAN) tablet 4 mg, 4 mg, Oral, Q6H PRN **OR** ondansetron (ZOFRAN) injection 4 mg, 4 mg, Intravenous, Q6H PRN, Theressa Millard, MD .  oxyCODONE (Oxy IR/ROXICODONE) immediate release tablet 5 mg, 5 mg, Oral, Q4H PRN, Theressa Millard, MD, 5 mg at 10/10/15 0331 .  sodium chloride flush (NS) 0.9 % injection 3 mL, 3 mL, Intravenous, Q12H, Theressa Millard, MD, 3 mL at 10/09/15 2202 .  sodium chloride flush (NS) 0.9 % injection 3 mL, 3 mL, Intravenous, PRN, Theressa Millard, MD:  . diclofenac  50 mg Oral BID  . enoxaparin (LOVENOX) injection  40 mg Subcutaneous Q24H  . feeding supplement (ENSURE ENLIVE)  237 mL Oral BID BM  . loratadine  10 mg Oral QPM  . sodium chloride flush  3 mL Intravenous Q12H  :  Allergies  Allergen Reactions  . Bee Venom   . Penicillins Hives    Has patient had a PCN reaction causing immediate rash, facial/tongue/throat swelling, SOB or lightheadedness with hypotension: No Has patient had a PCN reaction causing severe rash involving mucus membranes or skin necrosis: No Has patient had a PCN reaction that required hospitalization No Has patient had a PCN reaction occurring within the last 10 years: No If all of the above answers  are "NO", then may proceed with Cephalosporin use.   . Prednisone Rash    Had jock itch and it made it spread and made it worse  :  History reviewed. No pertinent family history.:  Social History   Social History  . Marital Status: Married    Spouse Name: N/A  . Number of Children: N/A  . Years of Education: N/A   Occupational History  . Works in [employer] is at [place]  . Smoking status: Former Smoker    Types: Cigarettes    Quit date: 10/09/2011  . Smokeless tobacco: Never Used  .  Alcohol Use: Yes     Comment: Socially  . Drug Use: No  . Sexual Activity: Yes    Birth Control/ Protection: None    Review of Systems:   Positives include:low back pain radiating into the right leg, pain is worse with sitting or standing. Pain with coughing. Difficult to take a deep breath. 20 pound weight loss.     A complete ROS was otherwise negative.   Physical Exam:  Blood pressure 121/76, pulse 83, temperature 97.9 F (36.6 C), temperature source Oral, resp. rate 16, height '6\' 1"'$  (1.854 m), weight 181 lb 10.5 oz (82.4 kg), SpO2 97 %.  HEENT:Oropharynx without visible mass, neck without mass Lungs: Clear bilaterally Cardiac: Regular rate and rhythm Abdomen: No hepatomegaly, no mass, nontender GU: Testes without mass VasculNo leg edema Lymph nodes:no cervical, supraclavicular, axillary, or inguinal nodes Neurologic: Alert and oriented, the motor exam appears intact in the upper and lower extremities Skin: No rash Musculoskeletal: Mild tenderness in the lumbar spine, no mass   LABS:   Recent Labs  10/09/15 2000 10/10/15 0340  WBC 15.0* 11.8*  HGB 14.9 14.3  HCT 40.9 39.5  PLT 241 247     Recent Labs  10/09/15 2000 10/10/15 0340  NA 140 140  K 4.3 4.4  CL 101 102  CO2 30 28  GLUCOSE 137* 171*  BUN 28* 32*  CREATININE 1.04 1.13  CALCIUM 9.7 9.5      RADIOLOGY:  Dg Chest 2 View  10/09/2015  CLINICAL DATA:  40 year old male with new diagnosis of metastatic disease to the spine. Smoker. No known primary malignancy. EXAM: CHEST  2 VIEW COMPARISON:  No priors. FINDINGS: Soft tissue fullness near the apex of the right upper lobe. Small nodular densities also project over the left upper lobe and right base. None of these demonstrate definitive correlate opacities on the lateral projections. No pleural effusions. No pneumothorax. No evidence of pulmonary edema. Heart size is normal. Fullness in the left hilar region. Soft tissue fullness also noted  throughout the paratracheal soft tissues in the upper mediastinum. IMPRESSION: 1. Soft tissue fullness in the right upper lobe near the apex, and additional nodular opacities throughout the lungs bilaterally. Given the patient's history of recently diagnosed metastatic disease to the spine, further evaluation with contrast enhanced chest CT is recommended in the near future to better evaluate these findings, as primary bronchogenic neoplasm in the right upper lobe is not excluded. 2. Contrast-enhanced chest CT will also be better able to evaluate the left hilar bilateral paratracheal fullness. Electronically Signed   By: Vinnie Langton M.D.   On: 10/09/2015 20:31    Assessment and Plan:   1. Low back/leg pain 2. MRI lumbar spine 10/06/2015 consistent with multiple bone metastases, probable pathologic compression fractures, and abnormal soft tissue surrounding the posterior elements 3. Chest  x-ray with soft tissue fullness in the right lung, nodular lung opacities, and mediastinal/hilar soft tissue fullness 4. History of tobacco use   Taylor Sanchez presents with severe low back pain. He appears to have a malignancy involving the lumbar spine. The differential diagnosis is broad. There is concern for a diagnosis of lung cancer given the chest x-ray findings and his smoking history.  I discussed the differential diagnosis and need for a diagnostic biopsy with Taylor Sanchez and his mother.  The plan is to proceed with diagnostic CT scans to look for a primary tumor site and accessible lesion to biopsy.  Recommendations: 1. Continue narcotic analgesics as needed for pain 2. Resume IV Decadron for progressive pain or new neurologic symptoms 3. CTs of the chest, abdomen, and pelvis 4. Consult interventional radiology or pulmonary medicine for a diagnostic biopsy pending the CT findings  Oncology will continue following him in the hospital. We will arrange for outpatient follow-up.    Betsy Coder, MD 10/10/2015, 8:52 AM

## 2015-10-10 NOTE — Progress Notes (Signed)
ANTICOAGULATION CONSULT NOTE - Initial Consult  Pharmacy Consult for heparin  Indication: pulmonary embolus  Allergies  Allergen Reactions  . Bee Venom   . Penicillins Hives    Has patient had a PCN reaction causing immediate rash, facial/tongue/throat swelling, SOB or lightheadedness with hypotension: No Has patient had a PCN reaction causing severe rash involving mucus membranes or skin necrosis: No Has patient had a PCN reaction that required hospitalization No Has patient had a PCN reaction occurring within the last 10 years: No If all of the above answers are "NO", then may proceed with Cephalosporin use.   . Prednisone Rash    Had jock itch and it made it spread and made it worse    Patient Measurements: Height: '6\' 1"'$  (185.4 cm) Weight: 181 lb 10.5 oz (82.4 kg) IBW/kg (Calculated) : 79.9   Vital Signs: Temp: 98.2 F (36.8 C) (05/29 1415) Temp Source: Oral (05/29 1415) BP: 122/77 mmHg (05/29 1415) Pulse Rate: 77 (05/29 1415)  Labs:  Recent Labs  10/09/15 1914 10/09/15 2000 10/10/15 0340  HGB 14.6 14.9 14.3  HCT 43.0 40.9 39.5  PLT  --  241 247  CREATININE 1.10 1.04 1.13    Estimated Creatinine Clearance: 100.2 mL/min (by C-G formula based on Cr of 1.13).   Medical History: Past Medical History  Diagnosis Date  . Herniated lumbar disc without myelopathy   . Right-sided low back pain with right-sided sciatica      Assessment: 40 year old male presents with severe lower back pain.  MRI consistent with multiple bone mets, concern for lung cancer.  CT shows small acute pulmonary embolus in lower lobe branch of right pulmonary artery.   Pharmacy consulted to dose heparin.  Pt received enoxaparin '40mg'$  SQ yesterday at 2200  CBC WNL SCr WNL Baseline PTT ordered Baseline PT/INR ordered  Goal of Therapy:  Heparin level 0.3-0.7 units/ml  Monitor platelets by anticoagulation protocol  Plan:  -Heparin bolus 2500 units x 1 -start heparin drip at 1600  units/hr -check heparin level in 6 hours -daily CBC -monitor for signs of bleeding  Dolly Rias RPh 10/10/2015, 3:50 PM Pager [phone]

## 2015-10-11 ENCOUNTER — Ambulatory Visit
Admit: 2015-10-11 | Discharge: 2015-10-11 | Disposition: A | Discharge status: Home / Self Care | Payer: BC Managed Care – PPO | Attending: Radiation Oncology | Admitting: Radiation Oncology

## 2015-10-11 ENCOUNTER — Encounter: Payer: Self-pay | Admitting: *Deleted

## 2015-10-11 ENCOUNTER — Inpatient Hospital Stay (HOSPITAL_COMMUNITY): Payer: BC Managed Care – PPO

## 2015-10-11 ENCOUNTER — Encounter (HOSPITAL_COMMUNITY): Payer: Self-pay | Admitting: General Surgery

## 2015-10-11 ENCOUNTER — Telehealth: Payer: Self-pay

## 2015-10-11 DIAGNOSIS — M5441 Lumbago with sciatica, right side: Secondary | ICD-10-CM

## 2015-10-11 DIAGNOSIS — R911 Solitary pulmonary nodule: Secondary | ICD-10-CM

## 2015-10-11 DIAGNOSIS — R918 Other nonspecific abnormal finding of lung field: Secondary | ICD-10-CM

## 2015-10-11 DIAGNOSIS — R599 Enlarged lymph nodes, unspecified: Secondary | ICD-10-CM

## 2015-10-11 DIAGNOSIS — C3490 Malignant neoplasm of unspecified part of unspecified bronchus or lung: Secondary | ICD-10-CM

## 2015-10-11 DIAGNOSIS — C7951 Secondary malignant neoplasm of bone: Secondary | ICD-10-CM

## 2015-10-11 HISTORY — DX: Malignant neoplasm of unspecified part of unspecified bronchus or lung: C34.90

## 2015-10-11 LAB — COMPREHENSIVE METABOLIC PANEL
ALBUMIN: 3.4 g/dL — AB (ref 3.5–5.0)
ALT: 29 U/L (ref 17–63)
AST: 23 U/L (ref 15–41)
Alkaline Phosphatase: 171 U/L — ABNORMAL HIGH (ref 38–126)
Anion gap: 9 (ref 5–15)
BUN: 26 mg/dL — AB (ref 6–20)
CHLORIDE: 103 mmol/L (ref 101–111)
CO2: 27 mmol/L (ref 22–32)
CREATININE: 0.93 mg/dL (ref 0.61–1.24)
Calcium: 8.9 mg/dL (ref 8.9–10.3)
GFR calc Af Amer: >60 mL/min (ref >60)
GFR calc non Af Amer: >60 mL/min (ref >60)
GLUCOSE: 116 mg/dL — AB (ref 65–99)
POTASSIUM: 4.4 mmol/L (ref 3.5–5.1)
Sodium: 139 mmol/L (ref 135–145)
Total Bilirubin: 0.5 mg/dL (ref 0.3–1.2)
Total Protein: 5.9 g/dL — ABNORMAL LOW (ref 6.5–8.1)

## 2015-10-11 LAB — HIV ANTIBODY (ROUTINE TESTING W REFLEX): HIV Screen 4th Generation wRfx: NONREACTIVE

## 2015-10-11 LAB — CBC
HEMATOCRIT: 37.4 % — AB (ref 39.0–52.0)
Hemoglobin: 13 g/dL (ref 13.0–17.0)
MCH: 29.2 pg (ref 26.0–34.0)
MCHC: 34.8 g/dL (ref 30.0–36.0)
MCV: 84 fL (ref 78.0–100.0)
PLATELETS: 241 10*3/uL (ref 150–400)
RBC: 4.45 MIL/uL (ref 4.22–5.81)
RDW: 11.8 % (ref 11.5–15.5)
WBC: 17.7 10*3/uL — AB (ref 4.0–10.5)

## 2015-10-11 LAB — HEPARIN LEVEL (UNFRACTIONATED)
Heparin Unfractionated: 0.72 IU/mL — ABNORMAL HIGH (ref 0.30–0.70)
Heparin Unfractionated: 0.33 IU/mL (ref 0.30–0.70)

## 2015-10-11 MED ORDER — DOCUSATE SODIUM 100 MG PO CAPS
200.0000 mg | ORAL_CAPSULE | Freq: Two times a day (BID) | ORAL | Status: DC
  Administered 2015-10-11 – 2015-10-12 (×3): 200 mg via ORAL
  Filled 2015-10-11 (×3): qty 2

## 2015-10-11 MED ORDER — HEPARIN (PORCINE) IN NACL 100-0.45 UNIT/ML-% IJ SOLN
1500.0000 [IU]/h | INTRAMUSCULAR | Status: AC
  Filled 2015-10-11: qty 250

## 2015-10-11 MED ORDER — MIDAZOLAM HCL 2 MG/2ML IJ SOLN
INTRAMUSCULAR | Status: AC
  Filled 2015-10-11: qty 2

## 2015-10-11 MED ORDER — FENTANYL CITRATE (PF) 100 MCG/2ML IJ SOLN
INTRAMUSCULAR | Status: AC
  Filled 2015-10-11: qty 2

## 2015-10-11 MED ORDER — HEPARIN (PORCINE) IN NACL 100-0.45 UNIT/ML-% IJ SOLN
1500.0000 [IU]/h | INTRAMUSCULAR | Status: DC
  Administered 2015-10-12 (×2): 1500 [IU]/h via INTRAVENOUS
  Filled 2015-10-11 (×4): qty 250

## 2015-10-11 MED ORDER — MIDAZOLAM HCL 2 MG/2ML IJ SOLN
INTRAMUSCULAR | Status: AC | PRN
  Administered 2015-10-11 (×2): 1 mg via INTRAVENOUS

## 2015-10-11 MED ORDER — MIDAZOLAM HCL 2 MG/2ML IJ SOLN
INTRAMUSCULAR | Status: AC
  Filled 2015-10-11: qty 4

## 2015-10-11 MED ORDER — OXYCODONE HCL 5 MG PO TABS
5.0000 mg | ORAL_TABLET | ORAL | Status: DC | PRN
  Administered 2015-10-11 – 2015-10-14 (×8): 10 mg via ORAL
  Filled 2015-10-11 (×8): qty 2

## 2015-10-11 MED ORDER — FENTANYL CITRATE (PF) 100 MCG/2ML IJ SOLN
INTRAMUSCULAR | Status: AC | PRN
  Administered 2015-10-11 (×2): 25 ug via INTRAVENOUS
  Administered 2015-10-11: 50 ug via INTRAVENOUS

## 2015-10-11 NOTE — Progress Notes (Signed)
PHARMACY - HEPARIN (brief note)  Patient on IV heparin for treatment of pulmonary embolism.  Patient s/p biopsy today  IV heparin currently infusing @ 1500 units/hr Heparin level = 0.33 (6 hr after heparin restarted) No complications of therapy noted  Plan:  Continue IV heparin @ 1500 units/hr           Will recheck heparin level in 6 hr to confirm therapeutic dose (prior to biopsy heparin level was higher on same rate).  Leone Haven, PharmD 10/11/15 @ 19:40

## 2015-10-11 NOTE — Progress Notes (Signed)
Initial Nutrition Assessment  DOCUMENTATION CODES:   Not applicable  INTERVENTION:  -Ensure Enlive po BID, each supplement provides 350 kcal and 20 grams of protein w/ advancement -RD to continue to monitor  NUTRITION DIAGNOSIS:   Inadequate oral intake related to inability to eat as evidenced by per patient/family report.  GOAL:   Patient will meet greater than or equal to 90% of their needs  MONITOR:   PO intake, Labs, Supplement acceptance, I & O's, Weight trends  REASON FOR ASSESSMENT:   Malnutrition Screening Tool    ASSESSMENT:   Taylor Sanchez is a 40 y.o. male who presents to the ED with complaints of severe 10/10 Low Back Pain worsening over the past 24 hours, with muscles spasms, and pain radiating down his Right leg.  Mr. Zimmerle is doing ok. His main complaint food wise is his inability to prepare food for himself with his lower back pain. He states a usual weight of around 195-200# and is currently 181. He was unsure what time span he lost this weight in. He states that his appetite is good and he will eat anything put in front of him, he simply can't make food for himself.   He has been having his mom and friends come make meals for him, and he also makes sandwiches and other easily prepared foods. Encouraged him to continue with easily prepared foods, and consume protein shakes/ensure in the meantime until his pain and PE can be controlled.  He was very hungry during the time of my visit but was going down to radiation shortly.  Documented PO intake 95-100%  No other acute complaints at this time.  Labs and medications reviewed.  Diet Order:  Diet regular Room service appropriate?: Yes; Fluid consistency:: Thin  Skin:  Reviewed, no issues  Last BM:  5/27  Height:   Ht Readings from Last 1 Encounters:  10/09/15 '6\' 1"'$  (1.854 m)    Weight:   Wt Readings from Last 1 Encounters:  10/09/15 181 lb 10.5 oz (82.4 kg)    Ideal Body Weight:  83.63  kg  BMI:  Body mass index is 23.97 kg/(m^2).  Estimated Nutritional Needs:   Kcal:  2050-2400  Protein:  100-125 grams  Fluid:  >/= 2L  EDUCATION NEEDS:   No education needs identified at this time  Satira Anis. Yameli Delamater, MS, RD LDN Inpatient Clinical Dietitian Pager [phone]

## 2015-10-11 NOTE — Consult Note (Signed)
Chief Complaint: L4 lytic lesion, back pain  Referring Physician:Dr. Kendal Hymen  Supervising Physician: Marybelle Killings  Patient Status: In-pt  HPI: Taylor Sanchez is an 40 y.o. male who began having some back pain in January.  It has progressively worsened to the point where he is now almost unable to stand and or walk at times.  He still has control of his bowels and bladder, just severe pain.  He saw his PCP who referred him for an MRI on 5/25.  He was supposed to follow up for the results, but his symptoms were severe enough he presented to the [facility] where he has been admitted.  He had a CT of the C/A/P that revealed  Osseous metastatic lesions are noted in the bilateral ribs, lumbar spine and right iliac bone.  Mediastinal adenopathy is noted as described above, concerning for metastatic disease.  Multiple nodular densities noted posteriorly in right lower lobe concerning for metastatic disease.  Probable post obstructive pneumonia or atelectasis is noted in the superior segment of the left lower lobe. There is the suggestion of a spiculated lesion in this area measuring 17 mm concerning for malignancy or metastatic disease.  Enlarged left periaortic and common iliac adenopathy is noted concerning for metastatic disease.  Small acute pulmonary embolus is noted in lower lobe branch of right pulmonary artery.   We have been asked to see him for a biopsy of one of these lesions to determine a pathology.  Past Medical History:  Past Medical History  Diagnosis Date  . Herniated lumbar disc without myelopathy   . Right-sided low back pain with right-sided sciatica     Past Surgical History:  Past Surgical History  Procedure Laterality Date  . Vasectomy  2007    Family History: History reviewed. No pertinent family history.  Social History:  reports that he quit smoking about 4 years ago. His smoking use included Cigarettes. He has never used smokeless  tobacco. He reports that he drinks alcohol. He reports that he does not use illicit drugs.  Allergies:  Allergies  Allergen Reactions  . Bee Venom   . Penicillins Hives    Has patient had a PCN reaction causing immediate rash, facial/tongue/throat swelling, SOB or lightheadedness with hypotension: No Has patient had a PCN reaction causing severe rash involving mucus membranes or skin necrosis: No Has patient had a PCN reaction that required hospitalization No Has patient had a PCN reaction occurring within the last 10 years: No If all of the above answers are "NO", then may proceed with Cephalosporin use.   . Prednisone Rash    Had jock itch and it made it spread and made it worse    Medications:   Medication List    ASK your doctor about these medications        cyclobenzaprine 10 MG tablet  Commonly known as:  FLEXERIL  Take 10 mg by mouth at bedtime.     diclofenac 50 MG EC tablet  Commonly known as:  VOLTAREN  Take 50 mg by mouth 2 (two) times daily.     levocetirizine 5 MG tablet  Commonly known as:  XYZAL  Take 5 mg by mouth every evening.     MULTIVITAMIN ADULT PO  Take 1 capsule by mouth daily.     traMADol 50 MG tablet  Commonly known as:  ULTRAM  Take 50 mg by mouth 2 (two) times daily.        Please HPI for pertinent  positives, otherwise complete 10 system ROS negative.  Mallampati Score: MD Evaluation Airway: WNL Heart: WNL Abdomen: WNL Chest/ Lungs: WNL ASA  Classification: 2 Mallampati/Airway Score: One  Physical Exam: BP 131/79 mmHg  Pulse 78  Temp(Src) 98 F (36.7 C) (Oral)  Resp 15  Ht 6' 1"  (1.854 m)  Wt 181 lb 10.5 oz (82.4 kg)  BMI 23.97 kg/m2  SpO2 97% Body mass index is 23.97 kg/(m^2). General: pleasant, WD, WN white male who is laying in bed in NAD HEENT: head is normocephalic, atraumatic.  Sclera are noninjected.  PERRL.  Ears and nose without any masses or lesions.  Mouth is pink and moist Heart: regular, rate, and rhythm.   Normal s1,s2. No obvious murmurs, gallops, or rubs noted.  Palpable radial and pedal pulses bilaterally Lungs: CTAB, no wheezes, rhonchi, or rales noted.  Respiratory effort nonlabored Abd: soft, NT, ND, +BS, no masses, hernias, or organomegaly Skin: warm and dry with no masses, lesions, or rashes Psych: A&Ox3 with an appropriate affect.   Labs: Results for orders placed or performed during the hospital encounter of 10/09/15 (from the past 48 hour(s))  I-stat Chem 8, ED     Status: Abnormal   Collection Time: 10/09/15  7:14 PM  Result Value Ref Range   Sodium 140 135 - 145 mmol/L   Potassium 4.3 3.5 - 5.1 mmol/L   Chloride 99 (L) 101 - 111 mmol/L   BUN 33 (H) 6 - 20 mg/dL   Creatinine, Ser 1.10 0.61 - 1.24 mg/dL   Glucose, Bld 108 (H) 65 - 99 mg/dL   Calcium, Ion 1.10 (L) 1.12 - 1.23 mmol/L   TCO2 31 0 - 100 mmol/L   Hemoglobin 14.6 13.0 - 17.0 g/dL   HCT 43.0 39.0 - 52.0 %  Comprehensive metabolic panel     Status: Abnormal   Collection Time: 10/09/15  8:00 PM  Result Value Ref Range   Sodium 140 135 - 145 mmol/L   Potassium 4.3 3.5 - 5.1 mmol/L   Chloride 101 101 - 111 mmol/L   CO2 30 22 - 32 mmol/L   Glucose, Bld 137 (H) 65 - 99 mg/dL   BUN 28 (H) 6 - 20 mg/dL   Creatinine, Ser 1.04 0.61 - 1.24 mg/dL   Calcium 9.7 8.9 - 10.3 mg/dL   Total Protein 6.9 6.5 - 8.1 g/dL   Albumin 4.0 3.5 - 5.0 g/dL   AST 33 15 - 41 U/L   ALT 31 17 - 63 U/L   Alkaline Phosphatase 200 (H) 38 - 126 U/L   Total Bilirubin 0.7 0.3 - 1.2 mg/dL   GFR calc non Af Amer >60 >60 mL/min   GFR calc Af Amer >60 >60 mL/min    Comment: (NOTE) The eGFR has been calculated using the CKD EPI equation. This calculation has not been validated in all clinical situations. eGFR's persistently <60 mL/min signify possible Chronic Kidney Disease.    Anion gap 9 5 - 15  CBC with Differential     Status: Abnormal   Collection Time: 10/09/15  8:00 PM  Result Value Ref Range   WBC 15.0 (H) 4.0 - 10.5 K/uL   RBC  4.87 4.22 - 5.81 MIL/uL   Hemoglobin 14.9 13.0 - 17.0 g/dL   HCT 40.9 39.0 - 52.0 %   MCV 84.0 78.0 - 100.0 fL   MCH 30.6 26.0 - 34.0 pg   MCHC 36.4 (H) 30.0 - 36.0 g/dL   RDW 11.8 11.5 - 15.5 %  Platelets 241 150 - 400 K/uL   Neutrophils Relative % 80 %   Neutro Abs 11.8 (H) 1.7 - 7.7 K/uL   Lymphocytes Relative 11 %   Lymphs Abs 1.7 0.7 - 4.0 K/uL   Monocytes Relative 5 %   Monocytes Absolute 0.8 0.1 - 1.0 K/uL   Eosinophils Relative 4 %   Eosinophils Absolute 0.6 0.0 - 0.7 K/uL   Basophils Relative 0 %   Basophils Absolute 0.1 0.0 - 0.1 K/uL  Basic metabolic panel     Status: Abnormal   Collection Time: 10/10/15  3:40 AM  Result Value Ref Range   Sodium 140 135 - 145 mmol/L   Potassium 4.4 3.5 - 5.1 mmol/L   Chloride 102 101 - 111 mmol/L   CO2 28 22 - 32 mmol/L   Glucose, Bld 171 (H) 65 - 99 mg/dL   BUN 32 (H) 6 - 20 mg/dL   Creatinine, Ser 1.13 0.61 - 1.24 mg/dL   Calcium 9.5 8.9 - 10.3 mg/dL   GFR calc non Af Amer >60 >60 mL/min   GFR calc Af Amer >60 >60 mL/min    Comment: (NOTE) The eGFR has been calculated using the CKD EPI equation. This calculation has not been validated in all clinical situations. eGFR's persistently <60 mL/min signify possible Chronic Kidney Disease.    Anion gap 10 5 - 15  CBC     Status: Abnormal   Collection Time: 10/10/15  3:40 AM  Result Value Ref Range   WBC 11.8 (H) 4.0 - 10.5 K/uL   RBC 4.72 4.22 - 5.81 MIL/uL   Hemoglobin 14.3 13.0 - 17.0 g/dL   HCT 39.5 39.0 - 52.0 %   MCV 83.7 78.0 - 100.0 fL   MCH 30.3 26.0 - 34.0 pg   MCHC 36.2 (H) 30.0 - 36.0 g/dL   RDW 11.7 11.5 - 15.5 %   Platelets 247 150 - 400 K/uL  APTT     Status: None   Collection Time: 10/10/15  3:38 PM  Result Value Ref Range   aPTT 30 24 - 37 seconds  Protime-INR     Status: None   Collection Time: 10/10/15  3:38 PM  Result Value Ref Range   Prothrombin Time 15.2 11.6 - 15.2 seconds   INR 1.18 0.00 - 1.49  Heparin level (unfractionated)     Status:  Abnormal   Collection Time: 10/10/15 10:04 PM  Result Value Ref Range   Heparin Unfractionated 0.72 (H) 0.30 - 0.70 IU/mL    Comment:        IF HEPARIN RESULTS ARE BELOW EXPECTED VALUES, AND PATIENT DOSAGE HAS BEEN CONFIRMED, SUGGEST FOLLOW UP TESTING OF ANTITHROMBIN III LEVELS.   CBC     Status: Abnormal   Collection Time: 10/11/15  4:15 AM  Result Value Ref Range   WBC 17.7 (H) 4.0 - 10.5 K/uL   RBC 4.45 4.22 - 5.81 MIL/uL   Hemoglobin 13.0 13.0 - 17.0 g/dL   HCT 37.4 (L) 39.0 - 52.0 %   MCV 84.0 78.0 - 100.0 fL   MCH 29.2 26.0 - 34.0 pg   MCHC 34.8 30.0 - 36.0 g/dL   RDW 11.8 11.5 - 15.5 %   Platelets 241 150 - 400 K/uL  Comprehensive metabolic panel     Status: Abnormal   Collection Time: 10/11/15  4:15 AM  Result Value Ref Range   Sodium 139 135 - 145 mmol/L   Potassium 4.4 3.5 - 5.1 mmol/L   Chloride  103 101 - 111 mmol/L   CO2 27 22 - 32 mmol/L   Glucose, Bld 116 (H) 65 - 99 mg/dL   BUN 26 (H) 6 - 20 mg/dL   Creatinine, Ser 0.93 0.61 - 1.24 mg/dL   Calcium 8.9 8.9 - 10.3 mg/dL   Total Protein 5.9 (L) 6.5 - 8.1 g/dL   Albumin 3.4 (L) 3.5 - 5.0 g/dL   AST 23 15 - 41 U/L   ALT 29 17 - 63 U/L   Alkaline Phosphatase 171 (H) 38 - 126 U/L   Total Bilirubin 0.5 0.3 - 1.2 mg/dL   GFR calc non Af Amer >60 >60 mL/min   GFR calc Af Amer >60 >60 mL/min    Comment: (NOTE) The eGFR has been calculated using the CKD EPI equation. This calculation has not been validated in all clinical situations. eGFR's persistently <60 mL/min signify possible Chronic Kidney Disease.    Anion gap 9 5 - 15  Heparin level (unfractionated)     Status: Abnormal   Collection Time: 10/11/15  7:41 AM  Result Value Ref Range   Heparin Unfractionated 0.72 (H) 0.30 - 0.70 IU/mL    Comment:        IF HEPARIN RESULTS ARE BELOW EXPECTED VALUES, AND PATIENT DOSAGE HAS BEEN CONFIRMED, SUGGEST FOLLOW UP TESTING OF ANTITHROMBIN III LEVELS.     Imaging: Dg Chest 2 View  10/09/2015  CLINICAL DATA:   40 year old male with new diagnosis of metastatic disease to the spine. Smoker. No known primary malignancy. EXAM: CHEST  2 VIEW COMPARISON:  No priors. FINDINGS: Soft tissue fullness near the apex of the right upper lobe. Small nodular densities also project over the left upper lobe and right base. None of these demonstrate definitive correlate opacities on the lateral projections. No pleural effusions. No pneumothorax. No evidence of pulmonary edema. Heart size is normal. Fullness in the left hilar region. Soft tissue fullness also noted throughout the paratracheal soft tissues in the upper mediastinum. IMPRESSION: 1. Soft tissue fullness in the right upper lobe near the apex, and additional nodular opacities throughout the lungs bilaterally. Given the patient's history of recently diagnosed metastatic disease to the spine, further evaluation with contrast enhanced chest CT is recommended in the near future to better evaluate these findings, as primary bronchogenic neoplasm in the right upper lobe is not excluded. 2. Contrast-enhanced chest CT will also be better able to evaluate the left hilar bilateral paratracheal fullness. Electronically Signed   By: Vinnie Langton M.D.   On: 10/09/2015 20:31   Ct Chest W Contrast  10/10/2015  CLINICAL DATA:  Spinal metastatic lesion.  Pulmonary nodules. EXAM: CT CHEST, ABDOMEN, AND PELVIS WITH CONTRAST TECHNIQUE: Multidetector CT imaging of the chest, abdomen and pelvis was performed following the standard protocol during bolus administration of intravenous contrast. CONTRAST:  174m ISOVUE-300 IOPAMIDOL (ISOVUE-300) INJECTION 61% COMPARISON:  Chest radiograph Oct 09, 2015. FINDINGS: CT CHEST Expansile lytic lesion is noted involving the lateral portion of the right ninth rib consistent with metastatic lesion. Also noted is lytic expansile lesion involving the posterior portion of the left eighth rib. No pneumothorax or pleural effusion is noted. Consolidation is noted  in the superior segment of the left lower lobe concerning for postobstructive pneumonia. 17 x 17 mm spiculated lesion is seen in this area concerning for malignancy. Multiple nodular densities are noted posteriorly in the right lower lobe with the largest measuring 18 x 13 mm. These are most consistent with metastatic disease given the  other findings present. There is no evidence of thoracic aortic dissection or aneurysm. There is noted a filling defect in a lower lobe branch of the right pulmonary artery consistent with pulmonary embolus. Right hilar adenopathy is noted, with the largest lymph node measuring 26 x 16 mm. Right peritracheal lymph node measuring 13 x 10 mm is noted. Sub carinal lymph node measuring 21 x 10 mm is noted. 14 x 10 mm lymph node is noted in aortopulmonary window. CT ABDOMEN AND PELVIS Lytic destruction is seen involving the L4 and L5 vertebral bodies, as well as the posterior elements of L5. Also noted is a lytic lesion in the L1 vertebral body. These are concerning for metastatic disease. Large lytic destructive lesion is seen in the right iliac wing. No gallstones are noted. The liver, spleen and pancreas are unremarkable. Adrenal glands and kidneys appear normal. No hydronephrosis or renal obstruction is noted. The appendix appears normal. There is no evidence of bowel obstruction. Urinary bladder appears normal. Mildly enlarged left periaortic lymph nodes are noted, as well as 14 x 13 mm left common iliac lymph node concerning for metastatic disease. IMPRESSION: Osseous metastatic lesions are noted in the bilateral ribs, lumbar spine and right iliac bone. Mediastinal adenopathy is noted as described above, concerning for metastatic disease. Multiple nodular densities noted posteriorly in right lower lobe concerning for metastatic disease. Probable post obstructive pneumonia or atelectasis is noted in the superior segment of the left lower lobe. There is the suggestion of a spiculated  lesion in this area measuring 17 mm concerning for malignancy or metastatic disease. Enlarged left periaortic and common iliac adenopathy is noted concerning for metastatic disease. Small acute pulmonary embolus is noted in lower lobe branch of right pulmonary artery. Critical Value/emergent results were called by telephone at the time of interpretation on 10/10/2015 at 3:18 pm to Dr. Niel Hummer , who verbally acknowledged these results. Electronically Signed   By: Marijo Conception, M.D.   On: 10/10/2015 15:19   Ct Abdomen Pelvis W Contrast  10/10/2015  CLINICAL DATA:  Spinal metastatic lesion.  Pulmonary nodules. EXAM: CT CHEST, ABDOMEN, AND PELVIS WITH CONTRAST TECHNIQUE: Multidetector CT imaging of the chest, abdomen and pelvis was performed following the standard protocol during bolus administration of intravenous contrast. CONTRAST:  136m ISOVUE-300 IOPAMIDOL (ISOVUE-300) INJECTION 61% COMPARISON:  Chest radiograph Oct 09, 2015. FINDINGS: CT CHEST Expansile lytic lesion is noted involving the lateral portion of the right ninth rib consistent with metastatic lesion. Also noted is lytic expansile lesion involving the posterior portion of the left eighth rib. No pneumothorax or pleural effusion is noted. Consolidation is noted in the superior segment of the left lower lobe concerning for postobstructive pneumonia. 17 x 17 mm spiculated lesion is seen in this area concerning for malignancy. Multiple nodular densities are noted posteriorly in the right lower lobe with the largest measuring 18 x 13 mm. These are most consistent with metastatic disease given the other findings present. There is no evidence of thoracic aortic dissection or aneurysm. There is noted a filling defect in a lower lobe branch of the right pulmonary artery consistent with pulmonary embolus. Right hilar adenopathy is noted, with the largest lymph node measuring 26 x 16 mm. Right peritracheal lymph node measuring 13 x 10 mm is noted. Sub  carinal lymph node measuring 21 x 10 mm is noted. 14 x 10 mm lymph node is noted in aortopulmonary window. CT ABDOMEN AND PELVIS Lytic destruction is seen involving the L4  and L5 vertebral bodies, as well as the posterior elements of L5. Also noted is a lytic lesion in the L1 vertebral body. These are concerning for metastatic disease. Large lytic destructive lesion is seen in the right iliac wing. No gallstones are noted. The liver, spleen and pancreas are unremarkable. Adrenal glands and kidneys appear normal. No hydronephrosis or renal obstruction is noted. The appendix appears normal. There is no evidence of bowel obstruction. Urinary bladder appears normal. Mildly enlarged left periaortic lymph nodes are noted, as well as 14 x 13 mm left common iliac lymph node concerning for metastatic disease. IMPRESSION: Osseous metastatic lesions are noted in the bilateral ribs, lumbar spine and right iliac bone. Mediastinal adenopathy is noted as described above, concerning for metastatic disease. Multiple nodular densities noted posteriorly in right lower lobe concerning for metastatic disease. Probable post obstructive pneumonia or atelectasis is noted in the superior segment of the left lower lobe. There is the suggestion of a spiculated lesion in this area measuring 17 mm concerning for malignancy or metastatic disease. Enlarged left periaortic and common iliac adenopathy is noted concerning for metastatic disease. Small acute pulmonary embolus is noted in lower lobe branch of right pulmonary artery. Critical Value/emergent results were called by telephone at the time of interpretation on 10/10/2015 at 3:18 pm to Dr. Niel Hummer , who verbally acknowledged these results. Electronically Signed   By: Marijo Conception, M.D.   On: 10/10/2015 15:19    Assessment/Plan 1. Multiple osseous lytic lesions -Dr. Barbie Banner has reviewed the patient's imaging.  We will plan for a bone biopsy of L4 vertebral body. -the patient is  on a heparin drip for an acute PE.  We will hold this an hour prior to the procedure, then it can be restarted after. -he is NPO and his labs and vitals have been reviewed. -Risks and Benefits discussed with the patient including, but not limited to bleeding, infection, damage to adjacent structures or low yield requiring additional tests. All of the patient's questions were answered, patient is agreeable to proceed. Consent signed and in chart.   Thank you for this interesting consult.  I greatly enjoyed meeting Couper Juncaj and look forward to participating in their care.  A copy of this report was sent to the requesting provider on this date.  Electronically Signed: Henreitta Cea 10/11/2015, 10:11 AM   I spent a total of 40 Minutes  in face to face in clinical consultation, greater than 50% of which was counseling/coordinating care for osseous lytic lesion of L4

## 2015-10-11 NOTE — Telephone Encounter (Signed)
Dr. Pablo Ledger asked me to call to the inpatient floor Mr. Kidane was on to speak to his nurse about if he was able to come to meet with Dr. Pablo Ledger at 1:30 and have CT simulation at 2:00 after his procedure today. I spoke with Joellen Jersey RN and she stated that he was doing well after his biopsy and would be able to come for CT simulation. I told her that we would have a transporter to pick him up at 1:15 to bring him to Radiation Oncology. She indicated that she would medicate him, to improve his tolerance of CT Simulation. I have spoken with CT Simulation and they are aware of this plan, and will have somebody to [address] [place] to get Mr. Koziol.

## 2015-10-11 NOTE — Sedation Documentation (Signed)
Patient denies pain and is resting comfortably.  

## 2015-10-11 NOTE — Progress Notes (Signed)
IP PROGRESS NOTE  Subjective:   He reports adequate pain control with oxycodone and Dilaudid. No new neurologic symptoms.  Objective: Vital signs in last 24 hours: Blood pressure 136/84, pulse 81, temperature 98 F (36.7 C), temperature source Oral, resp. rate 16, height '6\' 1"'$  (1.854 m), weight 181 lb 10.5 oz (82.4 kg), SpO2 100 %.  Intake/Output from previous day: 05/29 0701 - 05/30 0700 In: 3043.3 [P.O.:960; I.V.:2083.3] Out: 2000 [Urine:2000]  Physical Exam:   Extremities: No leg edema Neurologic: Moves both feet and legs  Portacath/PICC-without erythema  Lab Results:  Recent Labs  10/10/15 0340 10/11/15 0415  WBC 11.8* 17.7*  HGB 14.3 13.0  HCT 39.5 37.4*  PLT 247 241    BMET  Recent Labs  10/10/15 0340 10/11/15 0415  NA 140 139  K 4.4 4.4  CL 102 103  CO2 28 27  GLUCOSE 171* 116*  BUN 32* 26*  CREATININE 1.13 0.93  CALCIUM 9.5 8.9    Studies/Results: Dg Chest 2 View  10/09/2015  CLINICAL DATA:  40 year old male with new diagnosis of metastatic disease to the spine. Smoker. No known primary malignancy. EXAM: CHEST  2 VIEW COMPARISON:  No priors. FINDINGS: Soft tissue fullness near the apex of the right upper lobe. Small nodular densities also project over the left upper lobe and right base. None of these demonstrate definitive correlate opacities on the lateral projections. No pleural effusions. No pneumothorax. No evidence of pulmonary edema. Heart size is normal. Fullness in the left hilar region. Soft tissue fullness also noted throughout the paratracheal soft tissues in the upper mediastinum. IMPRESSION: 1. Soft tissue fullness in the right upper lobe near the apex, and additional nodular opacities throughout the lungs bilaterally. Given the patient's history of recently diagnosed metastatic disease to the spine, further evaluation with contrast enhanced chest CT is recommended in the near future to better evaluate these findings, as primary bronchogenic  neoplasm in the right upper lobe is not excluded. 2. Contrast-enhanced chest CT will also be better able to evaluate the left hilar bilateral paratracheal fullness. Electronically Signed   By: Vinnie Langton M.D.   On: 10/09/2015 20:31   Ct Chest W Contrast  10/10/2015  CLINICAL DATA:  Spinal metastatic lesion.  Pulmonary nodules. EXAM: CT CHEST, ABDOMEN, AND PELVIS WITH CONTRAST TECHNIQUE: Multidetector CT imaging of the chest, abdomen and pelvis was performed following the standard protocol during bolus administration of intravenous contrast. CONTRAST:  17m ISOVUE-300 IOPAMIDOL (ISOVUE-300) INJECTION 61% COMPARISON:  Chest radiograph Oct 09, 2015. FINDINGS: CT CHEST Expansile lytic lesion is noted involving the lateral portion of the right ninth rib consistent with metastatic lesion. Also noted is lytic expansile lesion involving the posterior portion of the left eighth rib. No pneumothorax or pleural effusion is noted. Consolidation is noted in the superior segment of the left lower lobe concerning for postobstructive pneumonia. 17 x 17 mm spiculated lesion is seen in this area concerning for malignancy. Multiple nodular densities are noted posteriorly in the right lower lobe with the largest measuring 18 x 13 mm. These are most consistent with metastatic disease given the other findings present. There is no evidence of thoracic aortic dissection or aneurysm. There is noted a filling defect in a lower lobe branch of the right pulmonary artery consistent with pulmonary embolus. Right hilar adenopathy is noted, with the largest lymph node measuring 26 x 16 mm. Right peritracheal lymph node measuring 13 x 10 mm is noted. Sub carinal lymph node measuring 21 x 10  mm is noted. 14 x 10 mm lymph node is noted in aortopulmonary window. CT ABDOMEN AND PELVIS Lytic destruction is seen involving the L4 and L5 vertebral bodies, as well as the posterior elements of L5. Also noted is a lytic lesion in the L1 vertebral  body. These are concerning for metastatic disease. Large lytic destructive lesion is seen in the right iliac wing. No gallstones are noted. The liver, spleen and pancreas are unremarkable. Adrenal glands and kidneys appear normal. No hydronephrosis or renal obstruction is noted. The appendix appears normal. There is no evidence of bowel obstruction. Urinary bladder appears normal. Mildly enlarged left periaortic lymph nodes are noted, as well as 14 x 13 mm left common iliac lymph node concerning for metastatic disease. IMPRESSION: Osseous metastatic lesions are noted in the bilateral ribs, lumbar spine and right iliac bone. Mediastinal adenopathy is noted as described above, concerning for metastatic disease. Multiple nodular densities noted posteriorly in right lower lobe concerning for metastatic disease. Probable post obstructive pneumonia or atelectasis is noted in the superior segment of the left lower lobe. There is the suggestion of a spiculated lesion in this area measuring 17 mm concerning for malignancy or metastatic disease. Enlarged left periaortic and common iliac adenopathy is noted concerning for metastatic disease. Small acute pulmonary embolus is noted in lower lobe branch of right pulmonary artery. Critical Value/emergent results were called by telephone at the time of interpretation on 10/10/2015 at 3:18 pm to Dr. Niel Hummer , who verbally acknowledged these results. Electronically Signed   By: Marijo Conception, M.D.   On: 10/10/2015 15:19   Ct Abdomen Pelvis W Contrast  10/10/2015  CLINICAL DATA:  Spinal metastatic lesion.  Pulmonary nodules. EXAM: CT CHEST, ABDOMEN, AND PELVIS WITH CONTRAST TECHNIQUE: Multidetector CT imaging of the chest, abdomen and pelvis was performed following the standard protocol during bolus administration of intravenous contrast. CONTRAST:  122m ISOVUE-300 IOPAMIDOL (ISOVUE-300) INJECTION 61% COMPARISON:  Chest radiograph Oct 09, 2015. FINDINGS: CT CHEST  Expansile lytic lesion is noted involving the lateral portion of the right ninth rib consistent with metastatic lesion. Also noted is lytic expansile lesion involving the posterior portion of the left eighth rib. No pneumothorax or pleural effusion is noted. Consolidation is noted in the superior segment of the left lower lobe concerning for postobstructive pneumonia. 17 x 17 mm spiculated lesion is seen in this area concerning for malignancy. Multiple nodular densities are noted posteriorly in the right lower lobe with the largest measuring 18 x 13 mm. These are most consistent with metastatic disease given the other findings present. There is no evidence of thoracic aortic dissection or aneurysm. There is noted a filling defect in a lower lobe branch of the right pulmonary artery consistent with pulmonary embolus. Right hilar adenopathy is noted, with the largest lymph node measuring 26 x 16 mm. Right peritracheal lymph node measuring 13 x 10 mm is noted. Sub carinal lymph node measuring 21 x 10 mm is noted. 14 x 10 mm lymph node is noted in aortopulmonary window. CT ABDOMEN AND PELVIS Lytic destruction is seen involving the L4 and L5 vertebral bodies, as well as the posterior elements of L5. Also noted is a lytic lesion in the L1 vertebral body. These are concerning for metastatic disease. Large lytic destructive lesion is seen in the right iliac wing. No gallstones are noted. The liver, spleen and pancreas are unremarkable. Adrenal glands and kidneys appear normal. No hydronephrosis or renal obstruction is noted. The appendix appears  normal. There is no evidence of bowel obstruction. Urinary bladder appears normal. Mildly enlarged left periaortic lymph nodes are noted, as well as 14 x 13 mm left common iliac lymph node concerning for metastatic disease. IMPRESSION: Osseous metastatic lesions are noted in the bilateral ribs, lumbar spine and right iliac bone. Mediastinal adenopathy is noted as described above,  concerning for metastatic disease. Multiple nodular densities noted posteriorly in right lower lobe concerning for metastatic disease. Probable post obstructive pneumonia or atelectasis is noted in the superior segment of the left lower lobe. There is the suggestion of a spiculated lesion in this area measuring 17 mm concerning for malignancy or metastatic disease. Enlarged left periaortic and common iliac adenopathy is noted concerning for metastatic disease. Small acute pulmonary embolus is noted in lower lobe branch of right pulmonary artery. Critical Value/emergent results were called by telephone at the time of interpretation on 10/10/2015 at 3:18 pm to Dr. Niel Hummer , who verbally acknowledged these results. Electronically Signed   By: Marijo Conception, M.D.   On: 10/10/2015 15:19    Medications: I have reviewed the patient's current medications.  Assessment/Plan: 1. Low back/leg pain 2. MRI lumbar spine 10/06/2015, CT scans 10/10/2015 reveal multiple destructive bone lesions, chest lymphadenopathy, lung nodules/masses 3. Probable postobstructive pneumonia in the left lower lobe on CT 10/10/2015 4. Small left lower right lower lobe pulmonary embolism on CT 10/10/2015-heparin initiated 10/10/2015  Mr. Kotarski appears unchanged. I reviewed the CT findings with him. I reviewed the images with interventional radiology. The lumbar spine tumor appears accessible to percutaneous biopsy. The most likely diagnosis is metastatic lung cancer.  He will be scheduled for a CT-guided biopsy of the paraspinous tumor at the lumbar spine.  I discussed the case with Dr. Pablo Ledger. Radiation oncology will see him today.  Recommendations: 1. Proceed with CT-guided biopsy of destructive lesion at the lumbar spine 2. Radiation oncology consult 3. Continue heparin anticoagulation for now 4. I will follow-up on the pathology from the CT biopsy and recommend systemic therapy. 5. Continue oxycodone/Dilaudid for  pain 6. Can add Decadron during radiation if Dr. Pablo Ledger recommends this 7. Oncology will continue following him the hospital and we will arrange for an outpatient appointment.    LOS: 1 day   Betsy Coder, MD   10/11/2015, 1:52 PM

## 2015-10-11 NOTE — Progress Notes (Addendum)
ANTICOAGULATION CONSULT NOTE - Follow Up Consult  Pharmacy Consult for Heparin Indication: pulmonary embolus  Allergies  Allergen Reactions  . Bee Venom   . Penicillins Hives    Has patient had a PCN reaction causing immediate rash, facial/tongue/throat swelling, SOB or lightheadedness with hypotension: No Has patient had a PCN reaction causing severe rash involving mucus membranes or skin necrosis: No Has patient had a PCN reaction that required hospitalization No Has patient had a PCN reaction occurring within the last 10 years: No If all of the above answers are "NO", then may proceed with Cephalosporin use.   . Prednisone Rash    Had jock itch and it made it spread and made it worse    Patient Measurements: Height: '6\' 1"'$  (185.4 cm) Weight: 181 lb 10.5 oz (82.4 kg) IBW/kg (Calculated) : 79.9 Heparin Dosing Weight: actual weight  Vital Signs: Temp: 98.3 F (36.8 C) (05/30 0525) Temp Source: Oral (05/30 0525) BP: 126/85 mmHg (05/30 0525) Pulse Rate: 69 (05/30 0525)  Labs:  Recent Labs  10/09/15 2000 10/10/15 0340 10/10/15 1538 10/10/15 2204 10/11/15 0415  HGB 14.9 14.3  --   --  13.0  HCT 40.9 39.5  --   --  37.4*  PLT 241 247  --   --  241  APTT  --   --  30  --   --   LABPROT  --   --  15.2  --   --   INR  --   --  1.18  --   --   HEPARINUNFRC  --   --   --  0.72*  --   CREATININE 1.04 1.13  --   --  0.93    Estimated Creatinine Clearance: 121.7 mL/min (by C-G formula based on Cr of 0.93).   Medications:  Infusions:  . sodium chloride 100 mL/hr at 10/11/15 0240  . heparin 1,500 Units/hr (10/11/15 9735)    Assessment: 40 year old Sanchez presented on 5/28 with severe lower back pain. MRI consistent with multiple bone mets, concern for lung cancer, pending biopsy. CT shows small acute pulmonary embolus in lower lobe branch of right pulmonary artery. Pharmacy consulted to dose heparin IV.  Today, 10/11/2015: Heparin level 0.72 remains therapeutic  CBC:  Hgb 13, Plt 241 SCr WNL  Goal of Therapy:  Heparin level 0.3-0.7 units/ml Monitor platelets by anticoagulation protocol: Yes   Plan:   Continue heparin IV infusion at 1500 units/hr  Daily heparin level and CBC  Continue to monitor H&H and platelets  Follow up long-term anticoagulation plan post procedures  Gretta Arab PharmD, BCPS Pager [phone] 10/11/2015 7:28 AM   Addendum:  HOLD heparin at 10:00 for 11:00 IR procedure   Post-procedure, Dr. Barbie Banner confirms no complications and to restart Heparin 1 hour after procedure.  Procedure time 11:50, restart heparin at 12:50.  Gretta Arab PharmD, BCPS Pager [phone] 10/11/2015 12:15 PM

## 2015-10-11 NOTE — Progress Notes (Signed)
PROGRESS NOTE    Taylor Sanchez  VOZ:[id number] DOB: Jul 10, 1975 DOA: 10/09/2015 PCP: Rob Hickman, family practice.    Brief Narrative: Taylor Sanchez is a 40 y.o. male who presents to the ED with complaints of severe 10/10 Low Back Pain worsening over the past 24 hours, with muscles spasms, and pain radiating down his Right leg. He had an MRI performed on 05/25 ( found on the [facility] ordered through [facility]), of which he was to follow up with his PCP on Tuesday 05/30 to go over the results. He was prescribed medications which had not relieved his pain. THe EDP discussed those results with patient and his mother who is at the bedside. The MRI reveals evidence of Lumbar metastatic Disease affecting the L4-L5 and L-5 -S-1). A Chest X-ray was performed and revealed findings suggestive of a Bronchogenic Carcinoma of the RUL. CT scan of the Chest and ABD were recommended, and he was referred for observation.    He reports that he has had pain since January which occurred after working out at [employer] and lifting heavy objects, and later the pain was exacerbated by rough housing with one of his friends. He denies any loss of bowel or bladder function. He reports not being able to stand and walk due to pain and spasms. He was evaluated in the ED and medicated with IV Toradol followed by doses of IV Robaxin, IV Dexamethasone, and IV Dilaudid which gave him mild relief.   Admitted with metastatic cancer to lumbar spine. CT with post obstructive PNA, possible spiculated lesion left  Lung. He Underwent L 4 process Biopsy 5-30. Pathology pending. Also Radiation oncologist consulted for radiation treatment for lumbar spine lesions.   Assessment & Plan:   Principal Problem:   Intractable back pain Active Problems:   Lumbar spine tumor   Right-sided low back pain with right-sided sciatica   Lung mass   Leukocytosis  1-Severe Back pain: Metastatic Diseases Lumbar spine,  compression fracture.  Patient had MRI 10-06-2015 done outside facility: Signal abnormality involving lumbar vertebra and sacrum as well as posterior elements that overall appears concerning for metastatic malignancy. 2. L4 vertebra appears expanded posteriorly towards the spinal canal likely secondary to tumor infiltration. There is loss of height of L4 vertebral concerning for pathologic compression fracture. There is depression of superior endplates of L3 and inferior endplate of L3, which could be related to Schmorls nodes, although pathologic compression fractures at these sites not excluded. 3. Abnormal soft tissue on the left at L3-L4 concerning for areas of a tumor infiltration with additional abnormal soft tissue surrounding the posterior elements of lower lumbar spine concerning for areas of tumor infiltration. 4. Transitional anatomy suspected with likely sacralization of L5 vertebra as described in prior plain film report.  -PRN robaxin, oxycodone.  -Radiation oncology consulted. Plan for radiation treatment inpatient.    2-Multiple lung nodularity right lung, possible spiculated lesion   left lung;  Underwent L 4 process Biopsy 5-30. Pathology pending. [name]   3-Post obstructive PNA; started IV Levaquin, day 2.  4-PE; On IV heparin.   Screening for HIV pending    DVT prophylaxis: Heparin  Code Status: Full code.  Family Communication: Mother at bedside.  Disposition Plan: Remain inpatient for pain controlled and further evaluation of  malignancy   Consultants:   Dr Benay Spice   Radiation oncology   Procedures:  none  Antimicrobials:  none   Subjective: Just came back from biopsy. Having severe pain at this time because  of the position his lying down.  His spirit is better   Objective: Filed Vitals:   10/11/15 1123 10/11/15 1124 10/11/15 1130 10/11/15 1134  BP: 133/92 132/87 135/85 136/84  Pulse: 78 88 83 81  Temp:      TempSrc:      Resp: '16 17 16 16  '$ Height:       Weight:      SpO2: 100% 100% 100% 100%    Intake/Output Summary (Last 24 hours) at 10/11/15 1206 Last data filed at 10/11/15 1000  Gross per 24 hour  Intake   3120 ml  Output   2600 ml  Net    520 ml   Filed Weights   10/09/15 1828 10/09/15 2208  Weight: 81.647 kg (180 lb) 82.4 kg (181 lb 10.5 oz)    Examination:  General exam: Appears calm and comfortable  Respiratory system: Clear to auscultation. Respiratory effort normal. Cardiovascular system: S1 & S2 heard, RRR. No JVD, murmurs, rubs, gallops or clicks. No pedal edema. Gastrointestinal system: Abdomen is nondistended, soft and nontender. No organomegaly or masses felt. Normal bowel sounds heard. Central nervous system: Alert and oriented. No focal neurological deficits. Difficulty raising B/L LE due to severe pain.  Extremities: Symmetric 5 x 5 power. Skin: No rashes, lesions or ulcers     Data Reviewed: I have personally reviewed following labs and imaging studies  CBC:  Recent Labs Lab 10/09/15 1914 10/09/15 2000 10/10/15 0340 10/11/15 0415  WBC  --  15.0* 11.8* 17.7*  NEUTROABS  --  11.8*  --   --   HGB 14.6 14.9 14.3 13.0  HCT 43.0 40.9 39.5 37.4*  MCV  --  84.0 83.7 84.0  PLT  --  241 247 637   Basic Metabolic Panel:  Recent Labs Lab 10/09/15 1914 10/09/15 2000 10/10/15 0340 10/11/15 0415  NA 140 140 140 139  K 4.3 4.3 4.4 4.4  CL 99* 101 102 103  CO2  --  '30 28 27  '$ GLUCOSE 108* 137* 171* 116*  BUN 33* 28* 32* 26*  CREATININE 1.10 1.04 1.13 0.93  CALCIUM  --  9.7 9.5 8.9   GFR: Estimated Creatinine Clearance: 121.7 mL/min (by C-G formula based on Cr of 0.93). Liver Function Tests:  Recent Labs Lab 10/09/15 2000 10/11/15 0415  AST 33 23  ALT 31 29  ALKPHOS 200* 171*  BILITOT 0.7 0.5  PROT 6.9 5.9*  ALBUMIN 4.0 3.4*   No results for input(s): LIPASE, AMYLASE in the last 168 hours. No results for input(s): AMMONIA in the last 168 hours. Coagulation Profile:  Recent  Labs Lab 10/10/15 1538  INR 1.18   Cardiac Enzymes: No results for input(s): CKTOTAL, CKMB, CKMBINDEX, TROPONINI in the last 168 hours. BNP (last 3 results) No results for input(s): PROBNP in the last 8760 hours. HbA1C: No results for input(s): HGBA1C in the last 72 hours. CBG: No results for input(s): GLUCAP in the last 168 hours. Lipid Profile: No results for input(s): CHOL, HDL, LDLCALC, TRIG, CHOLHDL, LDLDIRECT in the last 72 hours. Thyroid Function Tests: No results for input(s): TSH, T4TOTAL, FREET4, T3FREE, THYROIDAB in the last 72 hours. Anemia Panel: No results for input(s): VITAMINB12, FOLATE, FERRITIN, TIBC, IRON, RETICCTPCT in the last 72 hours. Sepsis Labs: No results for input(s): PROCALCITON, LATICACIDVEN in the last 168 hours.  No results found for this or any previous visit (from the past 240 hour(s)).       Radiology Studies: Dg Chest 2 View  10/09/2015  CLINICAL DATA:  40 year old male with new diagnosis of metastatic disease to the spine. Smoker. No known primary malignancy. EXAM: CHEST  2 VIEW COMPARISON:  No priors. FINDINGS: Soft tissue fullness near the apex of the right upper lobe. Small nodular densities also project over the left upper lobe and right base. None of these demonstrate definitive correlate opacities on the lateral projections. No pleural effusions. No pneumothorax. No evidence of pulmonary edema. Heart size is normal. Fullness in the left hilar region. Soft tissue fullness also noted throughout the paratracheal soft tissues in the upper mediastinum. IMPRESSION: 1. Soft tissue fullness in the right upper lobe near the apex, and additional nodular opacities throughout the lungs bilaterally. Given the patient's history of recently diagnosed metastatic disease to the spine, further evaluation with contrast enhanced chest CT is recommended in the near future to better evaluate these findings, as primary bronchogenic neoplasm in the right upper lobe is  not excluded. 2. Contrast-enhanced chest CT will also be better able to evaluate the left hilar bilateral paratracheal fullness. Electronically Signed   By: Vinnie Langton M.D.   On: 10/09/2015 20:31   Ct Chest W Contrast  10/10/2015  CLINICAL DATA:  Spinal metastatic lesion.  Pulmonary nodules. EXAM: CT CHEST, ABDOMEN, AND PELVIS WITH CONTRAST TECHNIQUE: Multidetector CT imaging of the chest, abdomen and pelvis was performed following the standard protocol during bolus administration of intravenous contrast. CONTRAST:  112m ISOVUE-300 IOPAMIDOL (ISOVUE-300) INJECTION 61% COMPARISON:  Chest radiograph Oct 09, 2015. FINDINGS: CT CHEST Expansile lytic lesion is noted involving the lateral portion of the right ninth rib consistent with metastatic lesion. Also noted is lytic expansile lesion involving the posterior portion of the left eighth rib. No pneumothorax or pleural effusion is noted. Consolidation is noted in the superior segment of the left lower lobe concerning for postobstructive pneumonia. 17 x 17 mm spiculated lesion is seen in this area concerning for malignancy. Multiple nodular densities are noted posteriorly in the right lower lobe with the largest measuring 18 x 13 mm. These are most consistent with metastatic disease given the other findings present. There is no evidence of thoracic aortic dissection or aneurysm. There is noted a filling defect in a lower lobe branch of the right pulmonary artery consistent with pulmonary embolus. Right hilar adenopathy is noted, with the largest lymph node measuring 26 x 16 mm. Right peritracheal lymph node measuring 13 x 10 mm is noted. Sub carinal lymph node measuring 21 x 10 mm is noted. 14 x 10 mm lymph node is noted in aortopulmonary window. CT ABDOMEN AND PELVIS Lytic destruction is seen involving the L4 and L5 vertebral bodies, as well as the posterior elements of L5. Also noted is a lytic lesion in the L1 vertebral body. These are concerning for  metastatic disease. Large lytic destructive lesion is seen in the right iliac wing. No gallstones are noted. The liver, spleen and pancreas are unremarkable. Adrenal glands and kidneys appear normal. No hydronephrosis or renal obstruction is noted. The appendix appears normal. There is no evidence of bowel obstruction. Urinary bladder appears normal. Mildly enlarged left periaortic lymph nodes are noted, as well as 14 x 13 mm left common iliac lymph node concerning for metastatic disease. IMPRESSION: Osseous metastatic lesions are noted in the bilateral ribs, lumbar spine and right iliac bone. Mediastinal adenopathy is noted as described above, concerning for metastatic disease. Multiple nodular densities noted posteriorly in right lower lobe concerning for metastatic disease. Probable post obstructive pneumonia or  atelectasis is noted in the superior segment of the left lower lobe. There is the suggestion of a spiculated lesion in this area measuring 17 mm concerning for malignancy or metastatic disease. Enlarged left periaortic and common iliac adenopathy is noted concerning for metastatic disease. Small acute pulmonary embolus is noted in lower lobe branch of right pulmonary artery. Critical Value/emergent results were called by telephone at the time of interpretation on 10/10/2015 at 3:18 pm to Dr. Niel Hummer , who verbally acknowledged these results. Electronically Signed   By: Marijo Conception, M.D.   On: 10/10/2015 15:19   Ct Abdomen Pelvis W Contrast  10/10/2015  CLINICAL DATA:  Spinal metastatic lesion.  Pulmonary nodules. EXAM: CT CHEST, ABDOMEN, AND PELVIS WITH CONTRAST TECHNIQUE: Multidetector CT imaging of the chest, abdomen and pelvis was performed following the standard protocol during bolus administration of intravenous contrast. CONTRAST:  158m ISOVUE-300 IOPAMIDOL (ISOVUE-300) INJECTION 61% COMPARISON:  Chest radiograph Oct 09, 2015. FINDINGS: CT CHEST Expansile lytic lesion is noted  involving the lateral portion of the right ninth rib consistent with metastatic lesion. Also noted is lytic expansile lesion involving the posterior portion of the left eighth rib. No pneumothorax or pleural effusion is noted. Consolidation is noted in the superior segment of the left lower lobe concerning for postobstructive pneumonia. 17 x 17 mm spiculated lesion is seen in this area concerning for malignancy. Multiple nodular densities are noted posteriorly in the right lower lobe with the largest measuring 18 x 13 mm. These are most consistent with metastatic disease given the other findings present. There is no evidence of thoracic aortic dissection or aneurysm. There is noted a filling defect in a lower lobe branch of the right pulmonary artery consistent with pulmonary embolus. Right hilar adenopathy is noted, with the largest lymph node measuring 26 x 16 mm. Right peritracheal lymph node measuring 13 x 10 mm is noted. Sub carinal lymph node measuring 21 x 10 mm is noted. 14 x 10 mm lymph node is noted in aortopulmonary window. CT ABDOMEN AND PELVIS Lytic destruction is seen involving the L4 and L5 vertebral bodies, as well as the posterior elements of L5. Also noted is a lytic lesion in the L1 vertebral body. These are concerning for metastatic disease. Large lytic destructive lesion is seen in the right iliac wing. No gallstones are noted. The liver, spleen and pancreas are unremarkable. Adrenal glands and kidneys appear normal. No hydronephrosis or renal obstruction is noted. The appendix appears normal. There is no evidence of bowel obstruction. Urinary bladder appears normal. Mildly enlarged left periaortic lymph nodes are noted, as well as 14 x 13 mm left common iliac lymph node concerning for metastatic disease. IMPRESSION: Osseous metastatic lesions are noted in the bilateral ribs, lumbar spine and right iliac bone. Mediastinal adenopathy is noted as described above, concerning for metastatic disease.  Multiple nodular densities noted posteriorly in right lower lobe concerning for metastatic disease. Probable post obstructive pneumonia or atelectasis is noted in the superior segment of the left lower lobe. There is the suggestion of a spiculated lesion in this area measuring 17 mm concerning for malignancy or metastatic disease. Enlarged left periaortic and common iliac adenopathy is noted concerning for metastatic disease. Small acute pulmonary embolus is noted in lower lobe branch of right pulmonary artery. Critical Value/emergent results were called by telephone at the time of interpretation on 10/10/2015 at 3:18 pm to Dr. BNiel Hummer, who verbally acknowledged these results. Electronically Signed   By: JJeneen Rinks  Murlean Caller, M.D.   On: 10/10/2015 15:19        Scheduled Meds: . diclofenac  50 mg Oral BID  . feeding supplement (ENSURE ENLIVE)  237 mL Oral BID BM  . levofloxacin (LEVAQUIN) IV  750 mg Intravenous Q24H  . loratadine  10 mg Oral QPM  . sodium chloride flush  3 mL Intravenous Q12H   Continuous Infusions: . sodium chloride 100 mL/hr at 10/11/15 0312     LOS: 1 day    Time spent: 35 minutes.     Elmarie Shiley, MD [facility] Pager [phone]  If 7PM-7AM, please contact night-coverage [email or web address] Password [email or web address] 10/11/2015, 12:06 PM

## 2015-10-11 NOTE — Procedures (Signed)
Left L4 transverse process biopsy 18 g times three No comp

## 2015-10-11 NOTE — Progress Notes (Signed)
Name: [name]   MRN: [id number]  Date:  10/11/2015  DOB: 1976-02-17  Status: Inpatient    DIAGNOSIS: Metastatic cancer to spine  CONSENT VERIFIED: yes   SET UP: Patient is setup supine   IMMOBILIZATION:  The following immobilization was used: Alpha cradle  NARRATIVE:  [name] was brought to the CT Simulation planning suite.  Identity was confirmed.  All relevant records and images related to the planned course of therapy were reviewed.  Then, the patient was positioned in a stable reproducible clinical set-up for radiation therapy.  CT images were obtained.  An isocenter was placed. Skin markings were placed.  The CT images were loaded into the planning software where the target and avoidance structures were contoured.  The radiation prescription was entered and confirmed. The patient was discharged in stable condition and tolerated simulation well.    TREATMENT PLANNING NOTE:  Treatment planning then occurred. I have requested : MLC's, isodose plan, basic dose calculation  I have requested 3 dimensional simulation with DVH of cord, kidneys, bowel and GTV  A total of 3 complex treatment devices will be used in the form of unique MLCS

## 2015-10-11 NOTE — Progress Notes (Signed)
  Radiation Oncology         [phone]) [phone] ________________________________  Initial Inpatient Consultation - Date: 10/11/2015   Name: Taylor Sanchez MRN: [id number]   DOB: [birth year]  REFERRING PHYSICIAN: No ref. provider found  DIAGNOSIS AND STAGE: Presumed stage IV non-small cell lung cancer (biopsy pending)  HISTORY OF PRESENT ILLNESS::Taylor Sanchez is a 40 y.o. male who presented to the ED on 10/09/15 for a 5 month history of lower back pain that has gradually gotten worse to the point he has to use a walker. He attributed it to an exercise related injury. The patient had an MRI of the lumbar spine performed on 10/06/15 at [place] by his PCP. The patient did not know his results at that time. The MRI showed metastatic disease involving the lumbar vertebra and sacrum and compression fractures at L3 and L4. X-ray of the chest on 10/09/15 revealed soft tissue fullness in the right upper lobe near the apex, throughout the paratracheal soft tissues in the upper mediastinum, and fullness in the left hilar region. There are also small nodular densities in the left upper lobe and right base. CT of the chest/abd/pelvis on 10/10/15 noted numerous osseous metastatic lesions, mediastinal adenopathy, nodular densities in the RLL, a 1.7 x 1.7 cm spiculated lesion in the LLL, enlarged left periaortic and common iliac adenopathy, and a small acute pulmonary embolus in the lower lobe branch of the right pulmonary artery. The patient underwent a CT biopsy earlier today.  PREVIOUS RADIATION THERAPY: No  Past medical, social and family history were reviewed in the electronic chart. Review of symptoms was reviewed in the electronic chart. Medications were reviewed in the electronic chart.   PHYSICAL EXAM:  There were no vitals filed for this visit.. . Vitals with BMI 10/11/2015  Height   Weight   BMI   Systolic 846  Diastolic 82  Pulse 82  Respirations 16   Alert and oriented. 5/5 strength in  the bilateral and upper extremities. Intact sensation to light touch. Lying flat in bed.  IMPRESSION: Presumed stage IV non-small cell lung cancer (biopsy pending)  PLAN: His biopsy results are pending. We believe that this is cancer. The only other possible explanation would be infection, but this is unlikely. We discussed the role of radiation and palliation of painful bone metastases. We discussed the process of CT simulation and the placement of tattoos. We discussed 10 treatments as an outpatient with possible side effects including, but not limited to, nausea, diarrhea, and fatigue. He signed a formed consent and this was placed in his medical chart.   We will follow up with his biopsy results tomorrow before beginning treatment. CT simulation was scheduled after this encounter.  I would recommend a brain MRI to complete staging while he is an inpatient anc once malignancy has ben confirmed.   I spent 40 minutes  face to face with the patient and more than 50% of that time was spent in counseling and/or coordination of care.   ------------------------------------------------  Thea Silversmith, MD  This document serves as a record of services personally performed by Thea Silversmith, MD. It was created on her behalf by Darcus Austin, a trained medical scribe. The creation of this record is based on the scribe's personal observations and the provider's statements to them. This document has been checked and approved by the attending provider.

## 2015-10-11 NOTE — Progress Notes (Signed)
Late entry for 9 a.m. Spoke with Joellen [place] nurse caring for Mr. Taylor Sanchez who is scheduled for a biopsy today at 11:00 a.m.  Let know he is for a CT simulation today.  Sim made aware of the biopsy procedure time. 11:10 Called Radiology having biopsy done.  Will be in recovery for 30-40 miinutes.   [place] with Katie nurse caring for Mr. Taylor Sanchez  will be returning to the floor around 1150 report received from the Radiology staff.  Simulation staff made aware of schedule and that he was having a lot of pain.  May move the time to 2 p.m. Instead of 1 p.m.[name] 1240 Received call the plan is to keep the 1:00 p.m. CT simulation time per Dr. Pablo Ledger gave report of the above mentioned information to [facility] to report to Dr. Pablo Ledger.

## 2015-10-11 NOTE — Progress Notes (Signed)
ANTICOAGULATION CONSULT NOTE - Follow Up Consult  Pharmacy Consult for Heparin Indication: pulmonary embolus  Allergies  Allergen Reactions  . Bee Venom   . Penicillins Hives    Has patient had a PCN reaction causing immediate rash, facial/tongue/throat swelling, SOB or lightheadedness with hypotension: No Has patient had a PCN reaction causing severe rash involving mucus membranes or skin necrosis: No Has patient had a PCN reaction that required hospitalization No Has patient had a PCN reaction occurring within the last 10 years: No If all of the above answers are "NO", then may proceed with Cephalosporin use.   . Prednisone Rash    Had jock itch and it made it spread and made it worse    Patient Measurements: Height: '6\' 1"'$  (185.4 cm) Weight: 181 lb 10.5 oz (82.4 kg) IBW/kg (Calculated) : 79.9 Heparin Dosing Weight:   Vital Signs: Temp: 97.8 F (36.6 C) (05/29 2121) Temp Source: Oral (05/29 2121) BP: 125/74 mmHg (05/29 2121) Pulse Rate: 86 (05/29 2121)  Labs:  Recent Labs  10/09/15 1914 10/09/15 2000 10/10/15 0340 10/10/15 1538 10/10/15 2204 10/11/15 0415  HGB 14.6 14.9 14.3  --   --  13.0  HCT 43.0 40.9 39.5  --   --  37.4*  PLT  --  241 247  --   --  241  APTT  --   --   --  30  --   --   LABPROT  --   --   --  15.2  --   --   INR  --   --   --  1.18  --   --   HEPARINUNFRC  --   --   --   --  0.72*  --   CREATININE 1.10 1.04 1.13  --   --   --     Estimated Creatinine Clearance: 100.2 mL/min (by C-G formula based on Cr of 1.13).   Medications:  Infusions:  . sodium chloride 100 mL/hr at 10/11/15 3888  . heparin 1,500 Units/hr (10/10/15 2234)    Assessment: Patient with heparin level above goal.  No heparin issues per RN.  Goal of Therapy:  Heparin level 0.3-0.7 units/ml Monitor platelets by anticoagulation protocol: Yes   Plan:  Decrease heparin to 1500 units/hr Recheck level at [place], Shea Stakes [place] 10/11/2015,4:51 AM

## 2015-10-12 ENCOUNTER — Inpatient Hospital Stay (HOSPITAL_COMMUNITY): Payer: BC Managed Care – PPO

## 2015-10-12 ENCOUNTER — Ambulatory Visit
Admit: 2015-10-12 | Discharge: 2015-10-12 | Disposition: A | Discharge status: Home / Self Care | Payer: BC Managed Care – PPO | Attending: Radiation Oncology | Admitting: Radiation Oncology

## 2015-10-12 DIAGNOSIS — M549 Dorsalgia, unspecified: Secondary | ICD-10-CM

## 2015-10-12 DIAGNOSIS — J189 Pneumonia, unspecified organism: Secondary | ICD-10-CM

## 2015-10-12 DIAGNOSIS — C801 Malignant (primary) neoplasm, unspecified: Secondary | ICD-10-CM

## 2015-10-12 DIAGNOSIS — S32000A Wedge compression fracture of unspecified lumbar vertebra, initial encounter for closed fracture: Secondary | ICD-10-CM | POA: Insufficient documentation

## 2015-10-12 DIAGNOSIS — I2699 Other pulmonary embolism without acute cor pulmonale: Secondary | ICD-10-CM

## 2015-10-12 DIAGNOSIS — I82401 Acute embolism and thrombosis of unspecified deep veins of right lower extremity: Secondary | ICD-10-CM

## 2015-10-12 DIAGNOSIS — C7949 Secondary malignant neoplasm of other parts of nervous system: Secondary | ICD-10-CM | POA: Insufficient documentation

## 2015-10-12 DIAGNOSIS — C7951 Secondary malignant neoplasm of bone: Principal | ICD-10-CM

## 2015-10-12 LAB — COMPREHENSIVE METABOLIC PANEL
ALBUMIN: 3.3 g/dL — AB (ref 3.5–5.0)
ALK PHOS: 186 U/L — AB (ref 38–126)
ALT: 25 U/L (ref 17–63)
ANION GAP: 9 (ref 5–15)
AST: 23 U/L (ref 15–41)
BILIRUBIN TOTAL: 0.4 mg/dL (ref 0.3–1.2)
BUN: 24 mg/dL — ABNORMAL HIGH (ref 6–20)
CALCIUM: 8.7 mg/dL — AB (ref 8.9–10.3)
CO2: 29 mmol/L (ref 22–32)
Chloride: 101 mmol/L (ref 101–111)
Creatinine, Ser: 0.93 mg/dL (ref 0.61–1.24)
GLUCOSE: 120 mg/dL — AB (ref 65–99)
POTASSIUM: 3.8 mmol/L (ref 3.5–5.1)
Sodium: 139 mmol/L (ref 135–145)
TOTAL PROTEIN: 6 g/dL — AB (ref 6.5–8.1)

## 2015-10-12 LAB — CBC
HEMATOCRIT: 38.4 % — AB (ref 39.0–52.0)
HEMOGLOBIN: 13.4 g/dL (ref 13.0–17.0)
MCH: 29.6 pg (ref 26.0–34.0)
MCHC: 34.9 g/dL (ref 30.0–36.0)
MCV: 85 fL (ref 78.0–100.0)
Platelets: 223 10*3/uL (ref 150–400)
RBC: 4.52 MIL/uL (ref 4.22–5.81)
RDW: 12 % (ref 11.5–15.5)
WBC: 11.8 10*3/uL — ABNORMAL HIGH (ref 4.0–10.5)

## 2015-10-12 LAB — HEPARIN LEVEL (UNFRACTIONATED): HEPARIN UNFRACTIONATED: 0.57 [IU]/mL (ref 0.30–0.70)

## 2015-10-12 MED ORDER — MORPHINE SULFATE ER 15 MG PO TBCR
15.0000 mg | EXTENDED_RELEASE_TABLET | Freq: Two times a day (BID) | ORAL | Status: DC
  Administered 2015-10-12 – 2015-10-13 (×4): 15 mg via ORAL
  Filled 2015-10-12 (×4): qty 1

## 2015-10-12 MED ORDER — POLYETHYLENE GLYCOL 3350 17 G PO PACK
17.0000 g | PACK | Freq: Every day | ORAL | Status: DC
  Administered 2015-10-12 – 2015-10-17 (×6): 17 g via ORAL
  Filled 2015-10-12 (×6): qty 1

## 2015-10-12 MED ORDER — DEXAMETHASONE 4 MG PO TABS
4.0000 mg | ORAL_TABLET | Freq: Two times a day (BID) | ORAL | Status: DC
Start: 2015-10-12 — End: 2015-10-17
  Administered 2015-10-12 – 2015-10-17 (×11): 4 mg via ORAL
  Filled 2015-10-12 (×11): qty 1

## 2015-10-12 NOTE — Progress Notes (Signed)
ANTICOAGULATION CONSULT NOTE - Follow Up Consult  Pharmacy Consult for Heparin Indication: pulmonary embolus  Allergies  Allergen Reactions  . Bee Venom   . Penicillins Hives    Has patient had a PCN reaction causing immediate rash, facial/tongue/throat swelling, SOB or lightheadedness with hypotension: No Has patient had a PCN reaction causing severe rash involving mucus membranes or skin necrosis: No Has patient had a PCN reaction that required hospitalization No Has patient had a PCN reaction occurring within the last 10 years: No If all of the above answers are "NO", then may proceed with Cephalosporin use.   . Prednisone Rash    Had jock itch and it made it spread and made it worse    Patient Measurements: Height: '6\' 1"'$  (185.4 cm) Weight: 181 lb 10.5 oz (82.4 kg) IBW/kg (Calculated) : 79.9 Heparin Dosing Weight:   Vital Signs: Temp: 98.2 F (36.8 C) (05/31 0200) Temp Source: Oral (05/31 0200) BP: 109/61 mmHg (05/31 0200) Pulse Rate: 96 (05/31 0200)  Labs:  Recent Labs  10/10/15 0340 10/10/15 1538  10/11/15 0415 10/11/15 0741 10/11/15 1849 10/12/15 0044  HGB 14.3  --   --  13.0  --   --  13.4  HCT 39.5  --   --  37.4*  --   --  38.4*  PLT 247  --   --  241  --   --  223  APTT  --  30  --   --   --   --   --   LABPROT  --  15.2  --   --   --   --   --   INR  --  1.18  --   --   --   --   --   HEPARINUNFRC  --   --   < >  --  0.72* 0.33 0.57  CREATININE 1.13  --   --  0.93  --   --  0.93  < > = values in this interval not displayed.  Estimated Creatinine Clearance: 121.7 mL/min (by C-G formula based on Cr of 0.93).   Medications:  Infusions:  . sodium chloride 100 mL/hr at 10/12/15 0003  . heparin 1,500 Units/hr (10/12/15 0003)    Assessment: Patient with heparin level at goal x2.  No heparin issues noted.  Goal of Therapy:  Heparin level 0.3-0.7 units/ml Monitor platelets by anticoagulation protocol: Yes   Plan:  Continue heparin drip at current  rate Recheck level with 6/1 AM labs  Tyler Deis, Shea Stakes Crowford 10/12/2015,4:52 AM

## 2015-10-12 NOTE — Progress Notes (Addendum)
VASCULAR LAB PRELIMINARY  PRELIMINARY  PRELIMINARY  PRELIMINARY  Bilateral lower extremity venous duplex completed.    Preliminary report:  Right:  DVT noted in the  Distal popliteal, anterior branch of the posterior tibial, and peroneal veins .  No evidence of superficial thrombosis.  No Baker's cyst. Left:  No evidence of DVT, superficial thrombosis, or Baker's cyst. Sluggish flow noted throughout bilaterally  Jakobe Blau, RVS 10/12/2015, 10:03 AM

## 2015-10-12 NOTE — Progress Notes (Signed)
PT Cancellation Note  Patient Details Name: Taylor Sanchez MRN: [id number] DOB: [birth year]   Cancelled Treatment:    Reason Eval/Treat Not Completed: Medical issues which prohibited therapy.  The  Patient reports that today he has had less pain/better control of pain. He is scheduled for radiation today and does not want to attempt  Sitting.  He also expresses concern for spine stability with the diagnosis  multiple destructive bone lesions per CT.   Dr. Benay Spice- please advise if a spinal brace is indicated for stability of the spine when  Mobilizing into  upright posture. Thank you   PT will return tomorrow.   Claretha Cooper 10/12/2015, 3:26 PM Tresa Endo PT [phone]

## 2015-10-12 NOTE — Progress Notes (Signed)
IP PROGRESS NOTE  Subjective:   He continues to have pain that is worse with movement. He reports tolerating the biopsy procedure well.  Objective: Vital signs in last 24 hours: Blood pressure 130/72, pulse 90, temperature 98.3 F (36.8 C), temperature source Oral, resp. rate 16, height '6\' 1"'$  (1.854 m), weight 181 lb 10.5 oz (82.4 kg), SpO2 97 %.  Intake/Output from previous day: 05/30 0701 - 05/31 0700 In: 2634 [I.V.:2334; IV Piggyback:300] Out: 2725 [Urine:2725]  Physical Exam:   Left lumbar biopsy site with a dry dressing Neurologic: Moves both feet and legs  Portacath/PICC-without erythema  Lab Results:  Recent Labs  10/11/15 0415 10/12/15 0044  WBC 17.7* 11.8*  HGB 13.0 13.4  HCT 37.4* 38.4*  PLT 241 223    BMET  Recent Labs  10/11/15 0415 10/12/15 0044  NA 139 139  K 4.4 3.8  CL 103 101  CO2 27 29  GLUCOSE 116* 120*  BUN 26* 24*  CREATININE 0.93 0.93  CALCIUM 8.9 8.7*    Studies/Results: Ct Chest W Contrast  10/10/2015  CLINICAL DATA:  Spinal metastatic lesion.  Pulmonary nodules. EXAM: CT CHEST, ABDOMEN, AND PELVIS WITH CONTRAST TECHNIQUE: Multidetector CT imaging of the chest, abdomen and pelvis was performed following the standard protocol during bolus administration of intravenous contrast. CONTRAST:  165m ISOVUE-300 IOPAMIDOL (ISOVUE-300) INJECTION 61% COMPARISON:  Chest radiograph Oct 09, 2015. FINDINGS: CT CHEST Expansile lytic lesion is noted involving the lateral portion of the right ninth rib consistent with metastatic lesion. Also noted is lytic expansile lesion involving the posterior portion of the left eighth rib. No pneumothorax or pleural effusion is noted. Consolidation is noted in the superior segment of the left lower lobe concerning for postobstructive pneumonia. 17 x 17 mm spiculated lesion is seen in this area concerning for malignancy. Multiple nodular densities are noted posteriorly in the right lower lobe with the largest measuring  18 x 13 mm. These are most consistent with metastatic disease given the other findings present. There is no evidence of thoracic aortic dissection or aneurysm. There is noted a filling defect in a lower lobe branch of the right pulmonary artery consistent with pulmonary embolus. Right hilar adenopathy is noted, with the largest lymph node measuring 26 x 16 mm. Right peritracheal lymph node measuring 13 x 10 mm is noted. Sub carinal lymph node measuring 21 x 10 mm is noted. 14 x 10 mm lymph node is noted in aortopulmonary window. CT ABDOMEN AND PELVIS Lytic destruction is seen involving the L4 and L5 vertebral bodies, as well as the posterior elements of L5. Also noted is a lytic lesion in the L1 vertebral body. These are concerning for metastatic disease. Large lytic destructive lesion is seen in the right iliac wing. No gallstones are noted. The liver, spleen and pancreas are unremarkable. Adrenal glands and kidneys appear normal. No hydronephrosis or renal obstruction is noted. The appendix appears normal. There is no evidence of bowel obstruction. Urinary bladder appears normal. Mildly enlarged left periaortic lymph nodes are noted, as well as 14 x 13 mm left common iliac lymph node concerning for metastatic disease. IMPRESSION: Osseous metastatic lesions are noted in the bilateral ribs, lumbar spine and right iliac bone. Mediastinal adenopathy is noted as described above, concerning for metastatic disease. Multiple nodular densities noted posteriorly in right lower lobe concerning for metastatic disease. Probable post obstructive pneumonia or atelectasis is noted in the superior segment of the left lower lobe. There is the suggestion of a spiculated  lesion in this area measuring 17 mm concerning for malignancy or metastatic disease. Enlarged left periaortic and common iliac adenopathy is noted concerning for metastatic disease. Small acute pulmonary embolus is noted in lower lobe branch of right pulmonary  artery. Critical Value/emergent results were called by telephone at the time of interpretation on 10/10/2015 at 3:18 pm to Dr. Niel Hummer , who verbally acknowledged these results. Electronically Signed   By: Marijo Conception, M.D.   On: 10/10/2015 15:19   Ct Abdomen Pelvis W Contrast  10/10/2015  CLINICAL DATA:  Spinal metastatic lesion.  Pulmonary nodules. EXAM: CT CHEST, ABDOMEN, AND PELVIS WITH CONTRAST TECHNIQUE: Multidetector CT imaging of the chest, abdomen and pelvis was performed following the standard protocol during bolus administration of intravenous contrast. CONTRAST:  136m ISOVUE-300 IOPAMIDOL (ISOVUE-300) INJECTION 61% COMPARISON:  Chest radiograph Oct 09, 2015. FINDINGS: CT CHEST Expansile lytic lesion is noted involving the lateral portion of the right ninth rib consistent with metastatic lesion. Also noted is lytic expansile lesion involving the posterior portion of the left eighth rib. No pneumothorax or pleural effusion is noted. Consolidation is noted in the superior segment of the left lower lobe concerning for postobstructive pneumonia. 17 x 17 mm spiculated lesion is seen in this area concerning for malignancy. Multiple nodular densities are noted posteriorly in the right lower lobe with the largest measuring 18 x 13 mm. These are most consistent with metastatic disease given the other findings present. There is no evidence of thoracic aortic dissection or aneurysm. There is noted a filling defect in a lower lobe branch of the right pulmonary artery consistent with pulmonary embolus. Right hilar adenopathy is noted, with the largest lymph node measuring 26 x 16 mm. Right peritracheal lymph node measuring 13 x 10 mm is noted. Sub carinal lymph node measuring 21 x 10 mm is noted. 14 x 10 mm lymph node is noted in aortopulmonary window. CT ABDOMEN AND PELVIS Lytic destruction is seen involving the L4 and L5 vertebral bodies, as well as the posterior elements of L5. Also noted is a lytic  lesion in the L1 vertebral body. These are concerning for metastatic disease. Large lytic destructive lesion is seen in the right iliac wing. No gallstones are noted. The liver, spleen and pancreas are unremarkable. Adrenal glands and kidneys appear normal. No hydronephrosis or renal obstruction is noted. The appendix appears normal. There is no evidence of bowel obstruction. Urinary bladder appears normal. Mildly enlarged left periaortic lymph nodes are noted, as well as 14 x 13 mm left common iliac lymph node concerning for metastatic disease. IMPRESSION: Osseous metastatic lesions are noted in the bilateral ribs, lumbar spine and right iliac bone. Mediastinal adenopathy is noted as described above, concerning for metastatic disease. Multiple nodular densities noted posteriorly in right lower lobe concerning for metastatic disease. Probable post obstructive pneumonia or atelectasis is noted in the superior segment of the left lower lobe. There is the suggestion of a spiculated lesion in this area measuring 17 mm concerning for malignancy or metastatic disease. Enlarged left periaortic and common iliac adenopathy is noted concerning for metastatic disease. Small acute pulmonary embolus is noted in lower lobe branch of right pulmonary artery. Critical Value/emergent results were called by telephone at the time of interpretation on 10/10/2015 at 3:18 pm to Dr. BNiel Hummer, who verbally acknowledged these results. Electronically Signed   By: JMarijo Conception M.D.   On: 10/10/2015 15:19   Ct Biopsy  10/11/2015  INDICATION: Multiple bone  lesions EXAM: CT BIOPSY MEDICATIONS: None. ANESTHESIA/SEDATION: Fentanyl 100 mcg IV; Versed to mg IV Moderate Sedation Time:  13 The patient was continuously monitored during the procedure by the interventional radiology nurse under my direct supervision. FLUOROSCOPY TIME:  Fluoroscopy Time:  minutes  seconds ( mGy). COMPLICATIONS: None immediate. PROCEDURE: Informed written  consent was obtained from the patient after a thorough discussion of the procedural risks, benefits and alternatives. All questions were addressed. Maximal Sterile Barrier Technique was utilized including caps, mask, sterile gowns, sterile gloves, sterile drape, hand hygiene and skin antiseptic. A timeout was performed prior to the initiation of the procedure. Under CT guidance, a(n) 17 gauge guide needle was advanced into the left L4 transverse process bone lesion. Subsequently 2 18 gauge core biopsies were obtained. The guide needle was removed. Post biopsy images demonstrate no hemorrhage. Patient tolerated the procedure well without complication. Vital sign monitoring by nursing staff during the procedure will continue as patient is in the special procedures unit for post procedure observation. The images document guide needle placement within the left L4 transverse process bone lesion. Post biopsy images demonstrate no hemorrhage. IMPRESSION: Successful CT-guided left L4 transverse process bone core biopsy. Electronically Signed   By: Marybelle Killings M.D.   On: 10/11/2015 17:16    Medications: I have reviewed the patient's current medications.  Assessment/Plan: 1. Low back/leg pain 2. MRI lumbar spine 10/06/2015, CT scans 10/10/2015 reveal multiple destructive bone lesions, chest lymphadenopathy, lung nodules/masses  CT-guided biopsy of L4 lesion 10/11/2015 3. Probable postobstructive pneumonia in the left lower lobe on CT 10/10/2015 4. Small left lower right lower lobe pulmonary embolism on CT 10/10/2015-heparin initiated 10/10/2015  Mr. Colucci appears unchanged. I discussed preliminary review of the 10/11/2015 biopsy with Dr. Saralyn Pilar this morning. He indicates the biopsies consistent with poorly differentiated carcinoma. He will perform immunohistochemical stains to define the primary tumor site. We will then proceed with molecular testing as indicated.  He continues to have significant pain. I  will add a long acting narcotic and Decadron. He can be discharged to complete outpatient radiation when he is ambulatory.  Recommendations: 1. Begin MS Contin, continue oxycodone 2. Add Decadron 3. Can convert anticoagulation to Xarelto 4. Physical therapy evaluation 5. Dopplers of legs 6. Begin palliative radiation to the lumbar spine 7. Oncology will continue following him the hospital and we will arrange for an outpatient appointment.    LOS: 2 days   Betsy Coder, MD   10/12/2015, 8:41 AM

## 2015-10-12 NOTE — Progress Notes (Signed)
PROGRESS NOTE    Khole Branch  IHK:[id number] DOB: 12-02-1975 DOA: 10/09/2015 PCP: Rob Hickman, family practice.    Brief Narrative: Taylor Sanchez is a 40 y.o. male who presents to the ED with complaints of severe 10/10 Low Back Pain worsening over the past 24 hours, with muscles spasms, and pain radiating down his Right leg. He had an MRI performed on 05/25 ( found on the [facility] ordered through [facility]), of which he was to follow up with his PCP on Tuesday 05/30 to go over the results. He was prescribed medications which had not relieved his pain. THe EDP discussed those results with patient and his mother who is at the bedside. The MRI reveals evidence of Lumbar metastatic Disease affecting the L4-L5 and L-5 -S-1). A Chest X-ray was performed and revealed findings suggestive of a Bronchogenic Carcinoma of the RUL. CT scan of the Chest and ABD were recommended, and he was referred for observation.    He reports that he has had pain since January which occurred after working out at [employer] and lifting heavy objects, and later the pain was exacerbated by rough housing with one of his friends. He denies any loss of bowel or bladder function. He reports not being able to stand and walk due to pain and spasms. He was evaluated in the ED and medicated with IV Toradol followed by doses of IV Robaxin, IV Dexamethasone, and IV Dilaudid which gave him mild relief.   Admitted with metastatic cancer to lumbar spine. CT with post obstructive PNA, possible spiculated lesion left  Lung. He Underwent L 4 process Biopsy 5-30. Pathology pending. Also Radiation oncologist consulted for radiation treatment for lumbar spine lesions.   Assessment & Plan:   Principal Problem:   Intractable back pain Active Problems:   Lumbar spine tumor   Right-sided low back pain with right-sided sciatica   Lung mass   Leukocytosis  1-Severe Back pain: Metastatic Diseases Lumbar spine,  compression fracture.  Patient had MRI 10-06-2015 done outside facility: Signal abnormality involving lumbar vertebra and sacrum as well as posterior elements that overall appears concerning for metastatic malignancy. 2. L4 vertebra appears expanded posteriorly towards the spinal canal likely secondary to tumor infiltration. There is loss of height of L4 vertebral concerning for pathologic compression fracture. There is depression of superior endplates of L3 and inferior endplate of L3, which could be related to Schmorls nodes, although pathologic compression fractures at these sites not excluded. 3. Abnormal soft tissue on the left at L3-L4 concerning for areas of a tumor infiltration with additional abnormal soft tissue surrounding the posterior elements of lower lumbar spine concerning for areas of tumor infiltration. 4. Transitional anatomy suspected with likely sacralization of L5 vertebra as described in prior plain film report.  -PRN robaxin, oxycodone.  -Radiation oncology consulted. Plan for radiation treatment inpatient.    2-Multiple lung nodularity right lung, possible spiculated lesion   left lung;  Underwent L 4 process Biopsy 5-30. Pathology pending.  3-Post obstructive PNA; started IV Levaquin, day 2.  4-PE/DVT; On IV heparin.   Screening for HIV , negative.   DVT prophylaxis: Heparin  Code Status: Full code.  Family Communication: Mother at bedside.  Disposition Plan: Remain inpatient for pain controlled and further evaluation of  malignancy   Consultants:   Oncology Dr Benay Spice   Radiation oncology   Procedures:  Ct guided bone biopsy on 5/30  Antimicrobials:  levaquin from 5/29   Subjective:  reported pain 3-4 at rest,  7-8 with activity, does not want to get up from bed,  Mother in room  Objective: Filed Vitals:   10/12/15 0620 10/12/15 1007 10/12/15 1350 10/12/15 1817  BP: 130/72 124/79 134/84 129/78  Pulse: 90 80 85 92  Temp: 98.3 F (36.8 C) 97.9  F (36.6 C) 98.1 F (36.7 C) 97.5 F (36.4 C)  TempSrc: Oral Oral Oral Oral  Resp:      Height:      Weight:      SpO2: 97% 95% 96% 96%    Intake/Output Summary (Last 24 hours) at 10/12/15 2139 Last data filed at 10/12/15 1817  Gross per 24 hour  Intake   1314 ml  Output   1600 ml  Net   -286 ml   Filed Weights   10/09/15 1828 10/09/15 2208  Weight: 81.647 kg (180 lb) 82.4 kg (181 lb 10.5 oz)    Examination:  General exam: Appears calm and comfortable  Respiratory system: Clear to auscultation. Respiratory effort normal. Cardiovascular system: S1 & S2 heard, RRR. No JVD, murmurs, rubs, gallops or clicks. No pedal edema. Gastrointestinal system: Abdomen is nondistended, soft and nontender. No organomegaly or masses felt. Normal bowel sounds heard. Central nervous system: Alert and oriented. No focal neurological deficits. Difficulty raising B/L LE due to severe pain.  Extremities: Symmetric 5 x 5 power. Skin: No rashes, lesions or ulcers     Data Reviewed: I have personally reviewed following labs and imaging studies  CBC:  Recent Labs Lab 10/09/15 1914 10/09/15 2000 10/10/15 0340 10/11/15 0415 10/12/15 0044  WBC  --  15.0* 11.8* 17.7* 11.8*  NEUTROABS  --  11.8*  --   --   --   HGB 14.6 14.9 14.3 13.0 13.4  HCT 43.0 40.9 39.5 37.4* 38.4*  MCV  --  84.0 83.7 84.0 85.0  PLT  --  241 247 241 465   Basic Metabolic Panel:  Recent Labs Lab 10/09/15 1914 10/09/15 2000 10/10/15 0340 10/11/15 0415 10/12/15 0044  NA 140 140 140 139 139  K 4.3 4.3 4.4 4.4 3.8  CL 99* 101 102 103 101  CO2  --  '30 28 27 29  '$ GLUCOSE 108* 137* 171* 116* 120*  BUN 33* 28* 32* 26* 24*  CREATININE 1.10 1.04 1.13 0.93 0.93  CALCIUM  --  9.7 9.5 8.9 8.7*   GFR: Estimated Creatinine Clearance: 121.7 mL/min (by C-G formula based on Cr of 0.93). Liver Function Tests:  Recent Labs Lab 10/09/15 2000 10/11/15 0415 10/12/15 0044  AST 33 23 23  ALT '31 29 25  '$ ALKPHOS 200* 171*  186*  BILITOT 0.7 0.5 0.4  PROT 6.9 5.9* 6.0*  ALBUMIN 4.0 3.4* 3.3*   No results for input(s): LIPASE, AMYLASE in the last 168 hours. No results for input(s): AMMONIA in the last 168 hours. Coagulation Profile:  Recent Labs Lab 10/10/15 1538  INR 1.18   Cardiac Enzymes: No results for input(s): CKTOTAL, CKMB, CKMBINDEX, TROPONINI in the last 168 hours. BNP (last 3 results) No results for input(s): PROBNP in the last 8760 hours. HbA1C: No results for input(s): HGBA1C in the last 72 hours. CBG: No results for input(s): GLUCAP in the last 168 hours. Lipid Profile: No results for input(s): CHOL, HDL, LDLCALC, TRIG, CHOLHDL, LDLDIRECT in the last 72 hours. Thyroid Function Tests: No results for input(s): TSH, T4TOTAL, FREET4, T3FREE, THYROIDAB in the last 72 hours. Anemia Panel: No results for input(s): VITAMINB12, FOLATE, FERRITIN, TIBC, IRON, RETICCTPCT in the last 72  hours. Sepsis Labs: No results for input(s): PROCALCITON, LATICACIDVEN in the last 168 hours.  No results found for this or any previous visit (from the past 240 hour(s)).       Radiology Studies: Ct Biopsy  10/11/2015  INDICATION: Multiple bone lesions EXAM: CT BIOPSY MEDICATIONS: None. ANESTHESIA/SEDATION: Fentanyl 100 mcg IV; Versed to mg IV Moderate Sedation Time:  13 The patient was continuously monitored during the procedure by the interventional radiology nurse under my direct supervision. FLUOROSCOPY TIME:  Fluoroscopy Time:  minutes  seconds ( mGy). COMPLICATIONS: None immediate. PROCEDURE: Informed written consent was obtained from the patient after a thorough discussion of the procedural risks, benefits and alternatives. All questions were addressed. Maximal Sterile Barrier Technique was utilized including caps, mask, sterile gowns, sterile gloves, sterile drape, hand hygiene and skin antiseptic. A timeout was performed prior to the initiation of the procedure. Under CT guidance, a(n) 17 gauge guide  needle was advanced into the left L4 transverse process bone lesion. Subsequently 2 18 gauge core biopsies were obtained. The guide needle was removed. Post biopsy images demonstrate no hemorrhage. Patient tolerated the procedure well without complication. Vital sign monitoring by nursing staff during the procedure will continue as patient is in the special procedures unit for post procedure observation. The images document guide needle placement within the left L4 transverse process bone lesion. Post biopsy images demonstrate no hemorrhage. IMPRESSION: Successful CT-guided left L4 transverse process bone core biopsy. Electronically Signed   By: Marybelle Killings M.D.   On: 10/11/2015 17:16        Scheduled Meds: . dexamethasone  4 mg Oral BID  . docusate sodium  200 mg Oral BID  . feeding supplement (ENSURE ENLIVE)  237 mL Oral BID BM  . levofloxacin (LEVAQUIN) IV  750 mg Intravenous Q24H  . loratadine  10 mg Oral QPM  . morphine  15 mg Oral Q12H  . polyethylene glycol  17 g Oral Daily  . sodium chloride flush  3 mL Intravenous Q12H   Continuous Infusions: . sodium chloride 100 mL/hr at 10/12/15 0941  . heparin 1,500 Units/hr (10/12/15 1653)     LOS: 2 days    Time spent: 25 minutes.     Florencia Reasons, MD PhD [facility] Pager [phone]  If 7PM-7AM, please contact night-coverage [email or web address] Password [facility] 10/12/2015, 9:39 PM

## 2015-10-13 ENCOUNTER — Ambulatory Visit
Admit: 2015-10-13 | Discharge: 2015-10-13 | Disposition: A | Discharge status: Home / Self Care | Payer: BC Managed Care – PPO | Attending: Radiation Oncology | Admitting: Radiation Oncology

## 2015-10-13 DIAGNOSIS — M25552 Pain in left hip: Secondary | ICD-10-CM

## 2015-10-13 MED ORDER — SENNOSIDES-DOCUSATE SODIUM 8.6-50 MG PO TABS: 1.0000 | ORAL_TABLET | Freq: Two times a day (BID) | ORAL | Status: DC

## 2015-10-13 MED ORDER — RIVAROXABAN 15 MG PO TABS
15.0000 mg | ORAL_TABLET | Freq: Two times a day (BID) | ORAL | Status: DC
  Administered 2015-10-13 – 2015-10-17 (×9): 15 mg via ORAL
  Filled 2015-10-13 (×12): qty 1

## 2015-10-13 MED ORDER — BISACODYL 10 MG RE SUPP
10.0000 mg | Freq: Once | RECTAL | Status: AC
  Administered 2015-10-13: 10 mg via RECTAL
  Filled 2015-10-13: qty 1

## 2015-10-13 MED ORDER — RIVAROXABAN 20 MG PO TABS: 20.0000 mg | ORAL_TABLET | Freq: Every day | ORAL | Status: DC

## 2015-10-13 MED ORDER — SENNOSIDES-DOCUSATE SODIUM 8.6-50 MG PO TABS
2.0000 | ORAL_TABLET | Freq: Two times a day (BID) | ORAL | Status: DC
  Administered 2015-10-13 – 2015-10-17 (×9): 2 via ORAL
  Filled 2015-10-13 (×9): qty 2

## 2015-10-13 MED ORDER — LEVOFLOXACIN 750 MG PO TABS
750.0000 mg | ORAL_TABLET | Freq: Every day | ORAL | Status: DC
  Administered 2015-10-13 – 2015-10-14 (×2): 750 mg via ORAL
  Filled 2015-10-13 (×2): qty 1

## 2015-10-13 NOTE — Evaluation (Signed)
Physical Therapy Evaluation Patient Details Name: Taylor Sanchez MRN: [id number] DOB: 10-Mar-1976 Today's Date: 10/13/2015   History of Present Illness  40 yo male admitted with intractable back pain, lung lesion, mets to L spine, sacrum, R iliac wing, ribs, PE, R LE DVT.   Clinical Impression  On eval, pt required Mod assist to stand for ~2-3 minutes at EOB with RW. Pt was able to perform some pre-gait tasks (marching, side steps). Pain limiting ability to attempt ambulation on today. Pt rated pain 9/10 with upright sitting posture and standing (4/10 at rest). Will follow and progress activity as tolerated. Pt states plan is for d/c home with family assisting as needed. Could possibly consider CIR consult if pt does not progress well enough to d/c home.     Follow Up Recommendations Home health PT;Supervision/Assistance - 24 hour    Equipment Recommendations  Rolling walker with 5" wheels    Recommendations for Other Services OT consult     Precautions / Restrictions Precautions Precautions: Fall;Back Precaution Comments: logroll for safety, comfort Restrictions Weight Bearing Restrictions: No      Mobility  Bed Mobility Overal bed mobility: Needs Assistance Bed Mobility: Rolling;Sidelying to Sit;Sit to Sidelying Rolling: Modified independent (Device/Increase time) Sidelying to sit: Min guard     Sit to sidelying: Min guard General bed mobility comments: close guard. Increased time.   Transfers Overall transfer level: Needs assistance Equipment used: Rolling walker (2 wheeled) Transfers: Sit to/from Stand Sit to Stand: Mod assist;From elevated surface         General transfer comment: Highly elevated bed surface. Increased time/effort for pt. VCs safety, hand placement.   Ambulation/Gait             General Gait Details: Pre-giat marching. Pt took a couple of side steps along side of bed with RW. Pain limiting ambulation attempt on today.   Stairs            Wheelchair Mobility    Modified Rankin (Stroke Patients Only)       Balance Overall balance assessment: Needs assistance         Standing balance support: Bilateral upper extremity supported;During functional activity Standing balance-Leahy Scale: Poor Standing balance comment: requiring RW                             Pertinent Vitals/Pain Pain Assessment: 0-10 Pain Score: 9  Pain Location:  back, bil hips with upright posture, WBing. 4/10 at rest Pain Intervention(s): RN gave pain meds during session;Premedicated before session;Repositioned    Home Living Family/patient expects to be discharged to:: Private residence Living Arrangements: Parent Available Help at Discharge: Family Type of Home: House Home Access: Stairs to enter Entrance Stairs-Rails: Right Entrance Stairs-Number of Steps: 2 Home Layout: One level Home Equipment: Environmental consultant - 4 wheels      Prior Function Level of Independence: Independent with assistive device(s)         Comments: using 4 wheeled walker     Hand Dominance        Extremity/Trunk Assessment   Upper Extremity Assessment: Overall WFL for tasks assessed           Lower Extremity Assessment: Generalized weakness C/o numbness L LE      Cervical / Trunk Assessment: Normal  Communication   Communication: No difficulties  Cognition Arousal/Alertness: Awake/alert Behavior During Therapy: WFL for tasks assessed/performed Overall Cognitive Status: Within Functional Limits for tasks assessed  General Comments      Exercises        Assessment/Plan    PT Assessment Patient needs continued PT services  PT Diagnosis Difficulty walking;Acute pain;Generalized weakness   PT Problem List Decreased strength;Decreased activity tolerance;Decreased balance;Decreased mobility;Pain;Decreased knowledge of use of DME  PT Treatment Interventions DME instruction;Gait training;Functional  mobility training;Therapeutic activities;Patient/family education;Therapeutic exercise;Balance training   PT Goals (Current goals can be found in the Care Plan section) Acute Rehab PT Goals Patient Stated Goal: less pain. be able to walk PT Goal Formulation: With patient/family Time For Goal Achievement: 10/27/15 Potential to Achieve Goals: Good    Frequency Min 3X/week   Barriers to discharge        Co-evaluation               End of Session   Activity Tolerance: Patient limited by pain Patient left: in bed;with call bell/phone within reach;with family/visitor present           Time: 1421-1431 PT Time Calculation (min) (ACUTE ONLY): 10 min   Charges:   PT Evaluation $PT Eval Moderate Complexity: 1 Procedure     PT G Codes:        Weston Anna, MPT Pager: [phone]

## 2015-10-13 NOTE — Discharge Instructions (Signed)
Information on my medicine - XARELTO (rivaroxaban)  This medication education was reviewed with me or my healthcare representative as part of my discharge preparation.  The pharmacist that spoke with me during my hospital stay was:  Darl Pikes, [place]? Xarelto was prescribed to treat blood clots that may have been found in the veins of your legs (deep vein thrombosis) or in your lungs (pulmonary embolism) and to reduce the risk of them occurring again.  What do you need to know about Xarelto? The starting dose is one 15 mg tablet taken TWICE daily with food for the FIRST 21 DAYS then on (November 03, 2015) 6/22  the dose is changed to one 20 mg tablet taken ONCE A DAY with your evening meal.  DO NOT stop taking Xarelto without talking to the health care provider who prescribed the medication.  Refill your prescription for 20 mg tablets before you run out.  After discharge, you should have regular check-up appointments with your healthcare provider that is prescribing your Xarelto.  In the future your dose may need to be changed if your kidney function changes by a significant amount.  What do you do if you miss a dose? If you are taking Xarelto TWICE DAILY and you miss a dose, take it as soon as you remember. You may take two 15 mg tablets (total 30 mg) at the same time then resume your regularly scheduled 15 mg twice daily the next day.  If you are taking Xarelto ONCE DAILY and you miss a dose, take it as soon as you remember on the same day then continue your regularly scheduled once daily regimen the next day. Do not take two doses of Xarelto at the same time.   Important Safety Information Xarelto is a blood thinner medicine that can cause bleeding. You should call your healthcare provider right away if you experience any of the following: ? Bleeding from an injury or your nose that does not stop. ? Unusual colored urine (red or dark brown) or  unusual colored stools (red or black). ? Unusual bruising for unknown reasons. ? A serious fall or if you hit your head (even if there is no bleeding).  Some medicines may interact with Xarelto and might increase your risk of bleeding while on Xarelto. To help avoid this, consult your healthcare provider or pharmacist prior to using any new prescription or non-prescription medications, including herbals, vitamins, non-steroidal anti-inflammatory drugs (NSAIDs) and supplements.  This website has more information on Xarelto: [email or web address]

## 2015-10-13 NOTE — Care Management Note (Signed)
Case Management Note  Patient Details  Name: Taylor Sanchez MRN: [id number] Date of Birth: [birth year]  Subjective/Objective:          40 yo admitted with Intractable back pain          Action/Plan: From home with mom.  [name].  Pt provided with free 30 day coupon for Xeralto.  Awaiting PT evaluation.  Will assist with DC planning as needed.  CM will continue to follow.  Expected Discharge Date:                  Expected Discharge Plan:  Home/Self Care  In-House Referral:     Discharge planning Services  CM Consult, Medication Assistance  Post Acute Care Choice:    Choice offered to:     DME Arranged:    DME Agency:     HH Arranged:    HH Agency:     Status of Service:  In process, will continue to follow  Medicare Important Message Given:    Date Medicare IM Given:    Medicare IM give by:    Date Additional Medicare IM Given:    Additional Medicare Important Message give by:     If discussed at [place] of Stay Meetings, dates discussed:    Additional CommentsLynnell Catalan, RN 10/13/2015, 12:27 PM  [phone]

## 2015-10-13 NOTE — Progress Notes (Signed)
IP PROGRESS NOTE  Subjective:   He continues to have low back pain. He has pain in the "left hip "area today. No motor weakness.  Objective: Vital signs in last 24 hours: Blood pressure 129/81, pulse 79, temperature 98.1 F (36.7 C), temperature source Oral, resp. rate 20, height '6\' 1"'$  (1.854 m), weight 181 lb 10.5 oz (82.4 kg), SpO2 98 %.  Intake/Output from previous day: 05/31 0701 - 06/01 0700 In: 480 [P.O.:480] Out: 3300 [Urine:3300]  Physical Exam:   Left lumbar biopsy site without bleeding or infection Neurologic: Moves both feet and legs    Lab Results:  Recent Labs  10/11/15 0415 10/12/15 0044  WBC 17.7* 11.8*  HGB 13.0 13.4  HCT 37.4* 38.4*  PLT 241 223    BMET  Recent Labs  10/11/15 0415 10/12/15 0044  NA 139 139  K 4.4 3.8  CL 103 101  CO2 27 29  GLUCOSE 116* 120*  BUN 26* 24*  CREATININE 0.93 0.93  CALCIUM 8.9 8.7*    Studies/Results: Ct Biopsy  10/11/2015  INDICATION: Multiple bone lesions EXAM: CT BIOPSY MEDICATIONS: None. ANESTHESIA/SEDATION: Fentanyl 100 mcg IV; Versed to mg IV Moderate Sedation Time:  13 The patient was continuously monitored during the procedure by the interventional radiology nurse under my direct supervision. FLUOROSCOPY TIME:  Fluoroscopy Time:  minutes  seconds ( mGy). COMPLICATIONS: None immediate. PROCEDURE: Informed written consent was obtained from the patient after a thorough discussion of the procedural risks, benefits and alternatives. All questions were addressed. Maximal Sterile Barrier Technique was utilized including caps, mask, sterile gowns, sterile gloves, sterile drape, hand hygiene and skin antiseptic. A timeout was performed prior to the initiation of the procedure. Under CT guidance, a(n) 17 gauge guide needle was advanced into the left L4 transverse process bone lesion. Subsequently 2 18 gauge core biopsies were obtained. The guide needle was removed. Post biopsy images demonstrate no hemorrhage. Patient  tolerated the procedure well without complication. Vital sign monitoring by nursing staff during the procedure will continue as patient is in the special procedures unit for post procedure observation. The images document guide needle placement within the left L4 transverse process bone lesion. Post biopsy images demonstrate no hemorrhage. IMPRESSION: Successful CT-guided left L4 transverse process bone core biopsy. Electronically Signed   By: Marybelle Killings M.D.   On: 10/11/2015 17:16    Medications: I have reviewed the patient's current medications.  Assessment/Plan: 1. Low back/leg pain 2. MRI lumbar spine 10/06/2015, CT scans 10/10/2015 reveal multiple destructive bone lesions, chest lymphadenopathy, lung nodules/masses  CT-guided biopsy of L4 lesion 10/11/2015  Palliative radiation to the lumbar spine initiated 10/12/2015 3. Probable postobstructive pneumonia in the left lower lobe on CT 10/10/2015 4. Small left lower right lower lobe pulmonary embolism on CT 10/10/2015, right leg DVT on a Doppler 10/12/2015-heparin initiated 10/10/2015  Mr. Capshaw appears unchanged. I discussed the pathology with Mr. Mcclure and his mother again this morning. Immunohistochemical stains are pending. We will direct molecular testing and recommends systemic therapy based on the final pathology.  We discussed anticoagulation options. We reviewed the risks associated with anticoagulation therapy. He will begin Xarelto.    Recommendations: 1. Continue MS Contin and oxycodone for pain 2. Continue Decadron 3. convert anticoagulation to Xarelto 4. Physical therapy evaluation, ambulation as tolerated, consider orthopedics evaluation to see if he would benefit from a brace 5. Outpatient follow-up will be scheduled at the [facility]    LOS: 3 days   Betsy Coder, MD  10/13/2015, 8:18 AM

## 2015-10-13 NOTE — Progress Notes (Signed)
PROGRESS NOTE    Taylor Sanchez  XIP:[id number] DOB: August 15, 1975 DOA: 10/09/2015 PCP: Rob Hickman, family practice.    Brief Narrative: Taylor Sanchez is a 40 y.o. male who presents to the ED with complaints of severe 10/10 Low Back Pain worsening over the past 24 hours, with muscles spasms, and pain radiating down his Right leg. He had an MRI performed on 05/25 ( found on the [facility] ordered through [facility]), of which he was to follow up with his PCP on Tuesday 05/30 to go over the results. He was prescribed medications which had not relieved his pain. THe EDP discussed those results with patient and his mother who is at the bedside. The MRI reveals evidence of Lumbar metastatic Disease affecting the L4-L5 and L-5 -S-1). A Chest X-ray was performed and revealed findings suggestive of a Bronchogenic Carcinoma of the RUL. CT scan of the Chest and ABD were recommended, and he was referred for observation.    He reports that he has had pain since January which occurred after working out at [employer] and lifting heavy objects, and later the pain was exacerbated by rough housing with one of his friends. He denies any loss of bowel or bladder function. He reports not being able to stand and walk due to pain and spasms. He was evaluated in the ED and medicated with IV Toradol followed by doses of IV Robaxin, IV Dexamethasone, and IV Dilaudid which gave him mild relief.   Admitted with metastatic cancer to lumbar spine. CT with post obstructive PNA, possible spiculated lesion left  Lung. He Underwent L 4 process Biopsy 5-30. Pathology pending. Also Radiation oncologist consulted for radiation treatment for lumbar spine lesions.   Assessment & Plan:   Principal Problem:   Intractable back pain Active Problems:   Lumbar spine tumor   Right-sided low back pain with right-sided sciatica   Lung mass   Leukocytosis   Bony metastasis (HCC)   Compression fracture of lumbar  vertebra (HCC)   Metastasis to spinal cord with unknown primary site [facility])   Pulmonary embolism (HCC)  1-Severe Back pain: Metastatic Diseases Lumbar spine, compression fracture.  Patient had MRI 10-06-2015 done outside facility: Signal abnormality involving lumbar vertebra and sacrum as well as posterior elements that overall appears concerning for metastatic malignancy. 2. L4 vertebra appears expanded posteriorly towards the spinal canal likely secondary to tumor infiltration. There is loss of height of L4 vertebral concerning for pathologic compression fracture. There is depression of superior endplates of L3 and inferior endplate of L3, which could be related to Schmorls nodes, although pathologic compression fractures at these sites not excluded. 3. Abnormal soft tissue on the left at L3-L4 concerning for areas of a tumor infiltration with additional abnormal soft tissue surrounding the posterior elements of lower lumbar spine concerning for areas of tumor infiltration. 4. Transitional anatomy suspected with likely sacralization of L5 vertebra as described in prior plain film report.  -PRN robaxin, oxycodone.  -Radiation oncology consulted. Plan for radiation treatment inpatient.  -i have talked to spine surgery who indicate that patient can bear weight and may benefit from back brace    2-Multiple lung nodularity right lung, possible spiculated lesion left lung;  Underwent L 4 process Biopsy 5-30. Pathology pending.  3-Post obstructive PNA;  Levaquin, plan for total of 5 days treatment  4-PE/DVT; was on IV heparin, now transitioned to xarelto per oncology recommendation  Constipation: stool softener.   Screening for HIV , negative.   DVT prophylaxis:  Heparin  Code Status: Full code.  Family Communication: Mother at bedside.  Disposition Plan: plan home on 6/2  Consultants:   Oncology Dr Benay Spice   Radiation oncology   Phone conversation with spine  surgeon  Procedures:  Ct guided bone biopsy on 5/30  Antimicrobials:  levaquin from 5/29   Subjective:  pain has improved, patient sit up after reassurance by spine surgeon. Mother in room  Objective: Filed Vitals:   10/12/15 1350 10/12/15 1817 10/12/15 2313 10/13/15 0620  BP: 134/84 129/78 129/85 129/81  Pulse: 85 92 88 79  Temp: 98.1 F (36.7 C) 97.5 F (36.4 C) 99.3 F (37.4 C) 98.1 F (36.7 C)  TempSrc: Oral Oral Oral Oral  Resp:   20 20  Height:      Weight:      SpO2: 96% 96% 97% 98%    Intake/Output Summary (Last 24 hours) at 10/13/15 0818 Last data filed at 10/13/15 0438  Gross per 24 hour  Intake    480 ml  Output   3300 ml  Net  -2820 ml   Filed Weights   10/09/15 1828 10/09/15 2208  Weight: 81.647 kg (180 lb) 82.4 kg (181 lb 10.5 oz)    Examination:  General exam: Appears calm and comfortable  Respiratory system: Clear to auscultation. Respiratory effort normal. Cardiovascular system: S1 & S2 heard, RRR. No JVD, murmurs, rubs, gallops or clicks. No pedal edema. Gastrointestinal system: Abdomen is nondistended, soft and nontender. No organomegaly or masses felt. Normal bowel sounds heard. Central nervous system: Alert and oriented. No focal neurological deficits. Difficulty raising B/L LE due to severe pain.  Extremities: Symmetric 5 x 5 power. Skin: No rashes, lesions or ulcers     Data Reviewed: I have personally reviewed following labs and imaging studies  CBC:  Recent Labs Lab 10/09/15 1914 10/09/15 2000 10/10/15 0340 10/11/15 0415 10/12/15 0044  WBC  --  15.0* 11.8* 17.7* 11.8*  NEUTROABS  --  11.8*  --   --   --   HGB 14.6 14.9 14.3 13.0 13.4  HCT 43.0 40.9 39.5 37.4* 38.4*  MCV  --  84.0 83.7 84.0 85.0  PLT  --  241 247 241 595   Basic Metabolic Panel:  Recent Labs Lab 10/09/15 1914 10/09/15 2000 10/10/15 0340 10/11/15 0415 10/12/15 0044  NA 140 140 140 139 139  K 4.3 4.3 4.4 4.4 3.8  CL 99* 101 102 103 101  CO2   --  '30 28 27 29  '$ GLUCOSE 108* 137* 171* 116* 120*  BUN 33* 28* 32* 26* 24*  CREATININE 1.10 1.04 1.13 0.93 0.93  CALCIUM  --  9.7 9.5 8.9 8.7*   GFR: Estimated Creatinine Clearance: 121.7 mL/min (by C-G formula based on Cr of 0.93). Liver Function Tests:  Recent Labs Lab 10/09/15 2000 10/11/15 0415 10/12/15 0044  AST 33 23 23  ALT '31 29 25  '$ ALKPHOS 200* 171* 186*  BILITOT 0.7 0.5 0.4  PROT 6.9 5.9* 6.0*  ALBUMIN 4.0 3.4* 3.3*   No results for input(s): LIPASE, AMYLASE in the last 168 hours. No results for input(s): AMMONIA in the last 168 hours. Coagulation Profile:  Recent Labs Lab 10/10/15 1538  INR 1.18   Cardiac Enzymes: No results for input(s): CKTOTAL, CKMB, CKMBINDEX, TROPONINI in the last 168 hours. BNP (last 3 results) No results for input(s): PROBNP in the last 8760 hours. HbA1C: No results for input(s): HGBA1C in the last 72 hours. CBG: No results for input(s): GLUCAP  in the last 168 hours. Lipid Profile: No results for input(s): CHOL, HDL, LDLCALC, TRIG, CHOLHDL, LDLDIRECT in the last 72 hours. Thyroid Function Tests: No results for input(s): TSH, T4TOTAL, FREET4, T3FREE, THYROIDAB in the last 72 hours. Anemia Panel: No results for input(s): VITAMINB12, FOLATE, FERRITIN, TIBC, IRON, RETICCTPCT in the last 72 hours. Sepsis Labs: No results for input(s): PROCALCITON, LATICACIDVEN in the last 168 hours.  No results found for this or any previous visit (from the past 240 hour(s)).       Radiology Studies: Ct Biopsy  10/11/2015  INDICATION: Multiple bone lesions EXAM: CT BIOPSY MEDICATIONS: None. ANESTHESIA/SEDATION: Fentanyl 100 mcg IV; Versed to mg IV Moderate Sedation Time:  13 The patient was continuously monitored during the procedure by the interventional radiology nurse under my direct supervision. FLUOROSCOPY TIME:  Fluoroscopy Time:  minutes  seconds ( mGy). COMPLICATIONS: None immediate. PROCEDURE: Informed written consent was obtained from the  patient after a thorough discussion of the procedural risks, benefits and alternatives. All questions were addressed. Maximal Sterile Barrier Technique was utilized including caps, mask, sterile gowns, sterile gloves, sterile drape, hand hygiene and skin antiseptic. A timeout was performed prior to the initiation of the procedure. Under CT guidance, a(n) 17 gauge guide needle was advanced into the left L4 transverse process bone lesion. Subsequently 2 18 gauge core biopsies were obtained. The guide needle was removed. Post biopsy images demonstrate no hemorrhage. Patient tolerated the procedure well without complication. Vital sign monitoring by nursing staff during the procedure will continue as patient is in the special procedures unit for post procedure observation. The images document guide needle placement within the left L4 transverse process bone lesion. Post biopsy images demonstrate no hemorrhage. IMPRESSION: Successful CT-guided left L4 transverse process bone core biopsy. Electronically Signed   By: Marybelle Killings M.D.   On: 10/11/2015 17:16        Scheduled Meds: . dexamethasone  4 mg Oral BID  . docusate sodium  200 mg Oral BID  . feeding supplement (ENSURE ENLIVE)  237 mL Oral BID BM  . levofloxacin (LEVAQUIN) IV  750 mg Intravenous Q24H  . loratadine  10 mg Oral QPM  . morphine  15 mg Oral Q12H  . polyethylene glycol  17 g Oral Daily  . sodium chloride flush  3 mL Intravenous Q12H   Continuous Infusions: . sodium chloride 100 mL/hr at 10/13/15 0438  . heparin 1,500 Units/hr (10/12/15 1653)     LOS: 3 days    Time spent: 25 minutes.     Florencia Reasons, MD PhD [facility] Pager [phone]  If 7PM-7AM, please contact night-coverage [email or web address] Password [facility] 10/13/2015, 8:18 AM

## 2015-10-13 NOTE — Plan of Care (Signed)
Problem: Pain Managment: Goal: General experience of comfort will improve Outcome: Progressing Baseline pain at 3/10 with addition of long acting morphine.

## 2015-10-13 NOTE — Progress Notes (Signed)
[facility] Therapy Treatment Record Phone [phone]   Radiation Therapy was administered to Taylor Sanchez on: 10/13/2015  4:30 PM and was treatment # 2 out of a planned course of 10 treatments.  Radiation Treatment  1). Beam photons 89m  2). Brachytherapy None  3). Stereotactic Radiosurgery None  4). Other Radiation None     Taylor Sanchez, [facility]

## 2015-10-13 NOTE — Progress Notes (Signed)
ANTICOAGULATION CONSULT NOTE - Follow Up Consult  Pharmacy Consult for Xarelto Indication: pulmonary embolus, RLE DVT  Allergies  Allergen Reactions  . Bee Venom   . Penicillins Hives    Has patient had a PCN reaction causing immediate rash, facial/tongue/throat swelling, SOB or lightheadedness with hypotension: No Has patient had a PCN reaction causing severe rash involving mucus membranes or skin necrosis: No Has patient had a PCN reaction that required hospitalization No Has patient had a PCN reaction occurring within the last 10 years: No If all of the above answers are "NO", then may proceed with Cephalosporin use.   . Prednisone Rash    Had jock itch and it made it spread and made it worse   Patient Measurements: Height: 6' 1"  (185.4 cm) Weight: 181 lb 10.5 oz (82.4 kg) IBW/kg (Calculated) : 79.9  Vital Signs: Temp: 98.1 F (36.7 C) (06/01 0620) Temp Source: Oral (06/01 0620) BP: 129/81 mmHg (06/01 0620) Pulse Rate: 79 (06/01 0620)  Labs:  Recent Labs  10/10/15 1538  10/11/15 0415 10/11/15 0741 10/11/15 1849 10/12/15 0044  HGB  --   --  13.0  --   --  13.4  HCT  --   --  37.4*  --   --  38.4*  PLT  --   --  241  --   --  223  APTT 30  --   --   --   --   --   LABPROT 15.2  --   --   --   --   --   INR 1.18  --   --   --   --   --   HEPARINUNFRC  --   < >  --  0.72* 0.33 0.57  CREATININE  --   --  0.93  --   --  0.93  < > = values in this interval not displayed.  Estimated Creatinine Clearance: 121.7 mL/min (by C-G formula based on Cr of 0.93).  Medications:  Infusions:  . sodium chloride 100 mL/hr at 10/13/15 0034   Assessment: 40 year old male admitted on 5/28 with severe lower back pain. MRI consistent with multiple bone mets, concern for lung cancer, plan biopsy. CT shows small acute pulmonary embolus in lower lobe branch of right pulmonary artery. Pharmacy consulted to dose heparin IV. Doppler positive for RLE DVT.  Lumber met biopsy 5/30  Onc  MD recommended change to Xarelto 5/31  6/1: Heparin discontinued, begin Xarelto  Goal of Therapy:  Monitor platelets by anticoagulation protocol: Yes   Plan:   Heparin discontinued ~ 0830 per MD order  Begin Xarelto 34m bid with meals x 21 days, followed by 230mdaily  Monitor sign/sx bleeding, CBC  Xarelto education  GrMinda DittoharmD Pager [phone]/05/2015, 9:48 AM

## 2015-10-13 NOTE — Plan of Care (Signed)
Problem: Bowel/Gastric: Goal: Will not experience complications related to bowel motility Outcome: Not Progressing LBM 5/25. New laxatives ordered and given 5/31.

## 2015-10-14 ENCOUNTER — Ambulatory Visit
Admit: 2015-10-14 | Discharge: 2015-10-14 | Disposition: A | Discharge status: Home / Self Care | Payer: BC Managed Care – PPO | Attending: Radiation Oncology | Admitting: Radiation Oncology

## 2015-10-14 ENCOUNTER — Other Ambulatory Visit: Payer: Self-pay | Admitting: *Deleted

## 2015-10-14 ENCOUNTER — Telehealth: Payer: Self-pay | Admitting: Oncology

## 2015-10-14 ENCOUNTER — Encounter: Payer: Self-pay | Admitting: Radiation Oncology

## 2015-10-14 DIAGNOSIS — D492 Neoplasm of unspecified behavior of bone, soft tissue, and skin: Secondary | ICD-10-CM

## 2015-10-14 DIAGNOSIS — C801 Malignant (primary) neoplasm, unspecified: Secondary | ICD-10-CM | POA: Insufficient documentation

## 2015-10-14 MED ORDER — MORPHINE SULFATE ER 30 MG PO TBCR
30.0000 mg | EXTENDED_RELEASE_TABLET | Freq: Two times a day (BID) | ORAL | Status: DC
Start: 2015-10-14 — End: 2015-10-17
  Administered 2015-10-14 – 2015-10-17 (×7): 30 mg via ORAL
  Filled 2015-10-14 (×7): qty 1

## 2015-10-14 MED ORDER — CYCLOBENZAPRINE HCL 5 MG PO TABS
5.0000 mg | ORAL_TABLET | Freq: Two times a day (BID) | ORAL | Status: DC
  Administered 2015-10-14 – 2015-10-17 (×7): 5 mg via ORAL
  Filled 2015-10-14 (×7): qty 1

## 2015-10-14 MED ORDER — SONAFINE EX EMUL
1.0000 "application " | Freq: Two times a day (BID) | CUTANEOUS | Status: DC
  Administered 2015-10-14: 1 via TOPICAL

## 2015-10-14 MED ORDER — GABAPENTIN 100 MG PO CAPS
100.0000 mg | ORAL_CAPSULE | Freq: Three times a day (TID) | ORAL | Status: DC
  Administered 2015-10-14 – 2015-10-15 (×4): 100 mg via ORAL
  Filled 2015-10-14 (×4): qty 1

## 2015-10-14 NOTE — Progress Notes (Signed)
IP PROGRESS NOTE  Subjective:   He reports improvement in the back spasms, but he continues to have severe pain when he sits or stands. No leg weakness.  Objective: Vital signs in last 24 hours: Blood pressure 127/67, pulse 78, temperature 98.2 F (36.8 C), temperature source Oral, resp. rate 19, height '6\' 1"'$  (1.854 m), weight 181 lb 10.5 oz (82.4 kg), SpO2 99 %.  Intake/Output from previous day: 06/01 0701 - 06/02 0700 In: 3055 [P.O.:720; I.V.:2335] Out: 2400 [Urine:2400]  Physical Exam: HEENT: No thrush Neurologic: Moves both feet and legs    Lab Results:  Recent Labs  10/12/15 0044  WBC 11.8*  HGB 13.4  HCT 38.4*  PLT 223    BMET  Recent Labs  10/12/15 0044  NA 139  K 3.8  CL 101  CO2 29  GLUCOSE 120*  BUN 24*  CREATININE 0.93  CALCIUM 8.7*    Studies/Results: No results found.  Medications: I have reviewed the patient's current medications.  Assessment/Plan: 1. Stage IV non-small cell lung cancer   MRI lumbar spine 10/06/2015, CT scans 10/10/2015 reveal multiple destructive bone lesions, chest lymphadenopathy, lung nodules/masses  CT-guided biopsy of L4 lesion 10/11/2015 confirmed poorly differentiated adenocarcinoma  Palliative radiation to the lumbar spine initiated 10/12/2015 3. Probable postobstructive pneumonia in the left lower lobe on CT 10/10/2015 4. Small left lower right lower lobe pulmonary embolism on CT 10/10/2015, right leg DVT on a Doppler 10/12/2015-heparin initiated 10/10/2015, switched to Xarelto 10/13/2015  Mr. Hing appears unchanged. He continues to have pain in the lower back with movement and weightbearing. I will increase the MS Contin dose. Hopefully the pain will improve with radiation. We can consider a neurosurgery or orthopedics evaluation to see if he would benefit from a brace or kyphoplasty.  Recommendations: 1. Increase MS Contin dose, continue oxycodone 2. Continue Decadron 3. Continue Xarelto 4. Physical  therapy  5. Continue daily radiation  Please call oncology as needed over the weekend. Outpatient follow-up will be scheduled at the Cancer center.    LOS: 4 days   Betsy Coder, MD   10/14/2015, 8:20 AM

## 2015-10-14 NOTE — Progress Notes (Signed)
PT Cancellation Note  Patient Details Name: Taylor Sanchez MRN: [id number] DOB: 03/21/1976   Cancelled Treatment:    Reason Eval/Treat Not Completed: Pain limiting ability to participate. Pt declined to participate at this time due to pain. Pt also states he has radiaton at 3:00. Will likely have to check back another day.    Weston Anna, MPT Pager: [phone]

## 2015-10-14 NOTE — Progress Notes (Addendum)
Nursing Note: Report given to [facility] [place],RN.[email or web address]

## 2015-10-14 NOTE — Progress Notes (Signed)
PROGRESS NOTE    [name]  HBZ:[id number] DOB: 08/13/1975 DOA: 10/09/2015 PCP: Rob Hickman, family practice.    Brief Narrative: Taylor Sanchez is a 40 y.o. male who presents to the ED with complaints of severe 10/10 Low Back Pain worsening over the past 24 hours, with muscles spasms, and pain radiating down his Right leg. He had an MRI performed on 05/25 ( found on the Care Anywhere ordered through [facility]), of which he was to follow up with his PCP on Tuesday 05/30 to go over the results. He was prescribed medications which had not relieved his pain. THe EDP discussed those results with patient and his mother who is at the bedside. The MRI reveals evidence of Lumbar metastatic Disease affecting the L4-L5 and L-5 -S-1). A Chest X-ray was performed and revealed findings suggestive of a Bronchogenic Carcinoma of the RUL. CT scan of the Chest and ABD were recommended, and he was referred for observation.    He reports that he has had pain since January which occurred after working out at [employer] and lifting heavy objects, and later the pain was exacerbated by rough housing with one of his friends. He denies any loss of bowel or bladder function. He reports not being able to stand and walk due to pain and spasms. He was evaluated in the ED and medicated with IV Toradol followed by doses of IV Robaxin, IV Dexamethasone, and IV Dilaudid which gave him mild relief.   Admitted with metastatic cancer to lumbar spine. CT with post obstructive PNA, possible spiculated lesion left  Lung. He Underwent L 4 process Biopsy 5-30. Pathology pending. Also Radiation oncologist consulted for radiation treatment for lumbar spine lesions.   Assessment & Plan:   Principal Problem:   Intractable back pain Active Problems:   Lumbar spine tumor   Right-sided low back pain with right-sided sciatica   Lung mass   Leukocytosis   Bony metastasis (HCC)   Compression fracture of lumbar  vertebra (HCC)   Metastasis to spinal cord with unknown primary site [facility])   Pulmonary embolism (HCC)  1-Severe Back pain: Metastatic Diseases Lumbar spine, compression fracture.  Patient had MRI 10-06-2015 done outside facility: Signal abnormality involving lumbar vertebra and sacrum as well as posterior elements that overall appears concerning for metastatic malignancy. 2. L4 vertebra appears expanded posteriorly towards the spinal canal likely secondary to tumor infiltration. There is loss of height of L4 vertebral concerning for pathologic compression fracture. There is depression of superior endplates of L3 and inferior endplate of L3, which could be related to Schmorls nodes, although pathologic compression fractures at these sites not excluded. 3. Abnormal soft tissue on the left at L3-L4 concerning for areas of a tumor infiltration with additional abnormal soft tissue surrounding the posterior elements of lower lumbar spine concerning for areas of tumor infiltration. 4. Transitional anatomy suspected with likely sacralization of L5 vertebra as described in prior plain film report.   -i have talked to spine surgery who states that patient can bear weight and may benefit from back brace, currently patient does not think back brace will help, he still refuse to work with physical therapy due to pain -continue XRT, steroids, pain control, continue adjust opioids analgesics, Schedule low dose neurontin/flexeril and titrate    2-Multiple lung nodularity right lung, possible spiculated lesion left lung;  Underwent L 4 process Biopsy 5-30. Pathology "POORLY DIFFERENTIATED NON-SMALL CELL CARCINOMA", molecular testing pending, appreciate oncology input  3-Post obstructive PNA;  Levaquin, finished total  of 5 days treatment, no cough, no fever.   4-PE/DVT; was on IV heparin, now transitioned to xarelto per oncology recommendation  Constipation: stool softener.   Screening for HIV ,  negative.   DVT prophylaxis: Heparin , now xarelto Code Status: Full code.  Family Communication: Mother and sister at bedside.  Disposition Plan: plan home in 1-2 days, once patient has adequate pain control  Consultants:   Oncology Dr Benay Spice   Radiation oncology   Phone conversation with spine surgeon  Procedures:  Ct guided bone biopsy on 5/30  Antimicrobials:  levaquin from 5/29 to 6/2   Subjective: Still c/o back pain and left side pain , he does not want to get up due to pain Mother and sister  in room  Objective: Filed Vitals:   10/13/15 2110 10/14/15 0515 10/14/15 1121 10/14/15 1417  BP: 136/89 127/67 140/87 124/79  Pulse: 85 78 85 76  Temp: 97.9 F (36.6 C) 98.2 F (36.8 C) 97.9 F (36.6 C) 98 F (36.7 C)  TempSrc: Oral Oral Oral Oral  Resp: '19 19  18  '$ Height:      Weight:      SpO2: 98% 99% 96% 97%    Intake/Output Summary (Last 24 hours) at 10/14/15 1732 Last data filed at 10/14/15 1418  Gross per 24 hour  Intake   2695 ml  Output   2600 ml  Net     95 ml   Filed Weights   10/09/15 1828 10/09/15 2208  Weight: 81.647 kg (180 lb) 82.4 kg (181 lb 10.5 oz)    Examination:  General exam: Appears calm and comfortable  Respiratory system: Clear to auscultation. Respiratory effort normal. Cardiovascular system: S1 & S2 heard, RRR. No JVD, murmurs, rubs, gallops or clicks. No pedal edema. Gastrointestinal system: Abdomen is nondistended, soft and nontender. No organomegaly or masses felt. Normal bowel sounds heard. Central nervous system: Alert and oriented. No focal neurological deficits. Difficulty raising B/L LE due to severe pain.  Extremities: Symmetric 5 x 5 power. Skin: No rashes, lesions or ulcers     Data Reviewed: I have personally reviewed following labs and imaging studies  CBC:  Recent Labs Lab 10/09/15 1914 10/09/15 2000 10/10/15 0340 10/11/15 0415 10/12/15 0044  WBC  --  15.0* 11.8* 17.7* 11.8*  NEUTROABS  --  11.8*   --   --   --   HGB 14.6 14.9 14.3 13.0 13.4  HCT 43.0 40.9 39.5 37.4* 38.4*  MCV  --  84.0 83.7 84.0 85.0  PLT  --  241 247 241 160   Basic Metabolic Panel:  Recent Labs Lab 10/09/15 1914 10/09/15 2000 10/10/15 0340 10/11/15 0415 10/12/15 0044  NA 140 140 140 139 139  K 4.3 4.3 4.4 4.4 3.8  CL 99* 101 102 103 101  CO2  --  '30 28 27 29  '$ GLUCOSE 108* 137* 171* 116* 120*  BUN 33* 28* 32* 26* 24*  CREATININE 1.10 1.04 1.13 0.93 0.93  CALCIUM  --  9.7 9.5 8.9 8.7*   GFR: Estimated Creatinine Clearance: 121.7 mL/min (by C-G formula based on Cr of 0.93). Liver Function Tests:  Recent Labs Lab 10/09/15 2000 10/11/15 0415 10/12/15 0044  AST 33 23 23  ALT '31 29 25  '$ ALKPHOS 200* 171* 186*  BILITOT 0.7 0.5 0.4  PROT 6.9 5.9* 6.0*  ALBUMIN 4.0 3.4* 3.3*   No results for input(s): LIPASE, AMYLASE in the last 168 hours. No results for input(s): AMMONIA in the  last 168 hours. Coagulation Profile:  Recent Labs Lab 10/10/15 1538  INR 1.18   Cardiac Enzymes: No results for input(s): CKTOTAL, CKMB, CKMBINDEX, TROPONINI in the last 168 hours. BNP (last 3 results) No results for input(s): PROBNP in the last 8760 hours. HbA1C: No results for input(s): HGBA1C in the last 72 hours. CBG: No results for input(s): GLUCAP in the last 168 hours. Lipid Profile: No results for input(s): CHOL, HDL, LDLCALC, TRIG, CHOLHDL, LDLDIRECT in the last 72 hours. Thyroid Function Tests: No results for input(s): TSH, T4TOTAL, FREET4, T3FREE, THYROIDAB in the last 72 hours. Anemia Panel: No results for input(s): VITAMINB12, FOLATE, FERRITIN, TIBC, IRON, RETICCTPCT in the last 72 hours. Sepsis Labs: No results for input(s): PROCALCITON, LATICACIDVEN in the last 168 hours.  No results found for this or any previous visit (from the past 240 hour(s)).       Radiology Studies: No results found.      Scheduled Meds: . cyclobenzaprine  5 mg Oral BID  . dexamethasone  4 mg Oral BID  .  feeding supplement (ENSURE ENLIVE)  237 mL Oral BID BM  . gabapentin  100 mg Oral TID  . loratadine  10 mg Oral QPM  . morphine  30 mg Oral Q12H  . polyethylene glycol  17 g Oral Daily  . Rivaroxaban  15 mg Oral BID WC  . [START ON 11/03/2015] rivaroxaban  20 mg Oral Q supper  . senna-docusate  2 tablet Oral BID  . sodium chloride flush  3 mL Intravenous Q12H   Continuous Infusions: . sodium chloride Stopped (10/14/15 1104)     LOS: 4 days    Time spent: 25 minutes.     Florencia Reasons, MD PhD [facility] Pager [phone]  If 7PM-7AM, please contact night-coverage [email or web address] Password [facility] 10/14/2015, 5:32 PM

## 2015-10-14 NOTE — Care Management Note (Signed)
Case Management Note  Patient Details  Name: Sylis Ketchum MRN: [id number] Date of Birth: 1975-10-13  Subjective/Objective:         40 yo admitted with Intractable back pain           Action/Plan: Pt to dc home with mom in [place].  Address: [address] [place] [address].  Pt offered choice for [facility] services and chose [facility].  [facility] rep contacted for referral.  Pt states he has a walker at home and would like to have a 3in1.  DME order placed.  [place] DME rep contacted. Will need MD orders for HHPT/OT at dc. CM will continue to follow for DC needs.  Expected Discharge Date:                  Expected Discharge Plan:  [place]  In-House Referral:     Discharge planning Services  CM Consult, Medication Assistance  Post Acute Care Choice:  Home Health Choice offered to:     DME Arranged:  3-N-1 DME Agency:  [place] Arranged:  PT, OT [facility] Agency:  [place]  Status of Service:  In process, will continue to follow  Medicare Important Message Given:    Date Medicare IM Given:    Medicare IM give by:    Date Additional Medicare IM Given:    Additional Medicare Important Message give by:     If discussed at [place] of Stay Meetings, dates discussed:    Additional Comments:  Lynnell Catalan, RN 10/14/2015, 2:34 PM

## 2015-10-14 NOTE — Telephone Encounter (Signed)
sent emailt o Melissa to [facility] hospital f/u appt or Dr Sherrill/6/9'@8'$ :30

## 2015-10-15 MED ORDER — GABAPENTIN 100 MG PO CAPS
200.0000 mg | ORAL_CAPSULE | Freq: Three times a day (TID) | ORAL | Status: DC
  Administered 2015-10-15 – 2015-10-17 (×6): 200 mg via ORAL
  Filled 2015-10-15 (×6): qty 2

## 2015-10-15 NOTE — Progress Notes (Signed)
PROGRESS NOTE    Pike Scantlebury  NKN:[id number] DOB: 07/14/1975 DOA: 10/09/2015 PCP: Rob Hickman, family practice.    Brief Narrative: Taylor Sanchez is a 40 y.o. male who presents to the ED with complaints of severe 10/10 Low Back Pain worsening over the past 24 hours, with muscles spasms, and pain radiating down his Right leg. He had an MRI performed on 05/25 ( found on the Care Anywhere ordered through [facility] of which he was to follow up with his PCP on Tuesday 05/30 to go over the results. He was prescribed medications which had not relieved his pain. THe EDP discussed those results with patient and his mother who is at the bedside. The MRI reveals evidence of Lumbar metastatic Disease affecting the L4-L5 and L-5 -S-1). A Chest X-ray was performed and revealed findings suggestive of a Bronchogenic Carcinoma of the RUL. CT scan of the Chest and ABD were recommended, and he was referred for observation.    He reports that he has had pain since January which occurred after working out at [employer] and lifting heavy objects, and later the pain was exacerbated by rough housing with one of his friends. He denies any loss of bowel or bladder function. He reports not being able to stand and walk due to pain and spasms. He was evaluated in the ED and medicated with IV Toradol followed by doses of IV Robaxin, IV Dexamethasone, and IV Dilaudid which gave him mild relief.   Admitted with metastatic cancer to lumbar spine. CT with post obstructive PNA, possible spiculated lesion left  Lung. He Underwent L 4 process Biopsy 5-30. Pathology pending. Also Radiation oncologist consulted for radiation treatment for lumbar spine lesions.   Assessment & Plan:   Principal Problem:   Intractable back pain Active Problems:   Lumbar spine tumor   Right-sided low back pain with right-sided sciatica   Lung mass   Leukocytosis   Bony metastasis (HCC)   Compression fracture of lumbar  vertebra (HCC)   Metastasis to spinal cord with unknown primary site [facility])   Pulmonary embolism (HCC)   Cancer (HCC)  1-Severe Back pain: Metastatic Diseases Lumbar spine, compression fracture.  Patient had MRI 10-06-2015 done outside facility: Signal abnormality involving lumbar vertebra and sacrum as well as posterior elements that overall appears concerning for metastatic malignancy. 2. L4 vertebra appears expanded posteriorly towards the spinal canal likely secondary to tumor infiltration. There is loss of height of L4 vertebral concerning for pathologic compression fracture. There is depression of superior endplates of L3 and inferior endplate of L3, which could be related to Schmorls nodes, although pathologic compression fractures at these sites not excluded. 3. Abnormal soft tissue on the left at L3-L4 concerning for areas of a tumor infiltration with additional abnormal soft tissue surrounding the posterior elements of lower lumbar spine concerning for areas of tumor infiltration. 4. Transitional anatomy suspected with likely sacralization of L5 vertebra as described in prior plain film report.   -i have talked to spine surgery who states that patient can bear weight and may benefit from back brace, currently patient does not think back brace will help, he still refuse to work with physical therapy due to pain -continue XRT, steroids, pain control, continue adjust opioids analgesics, Schedule low dose neurontin/flexeril and titrate -6/3 reported pain is better, states he tired to get up but only was able to stand up for 20-30 seconds due to pain, think scheduled neurontin and flexeril has helped, will increase dose, anticipate discharge  in am with home health    2-Multiple lung nodularity right lung, possible spiculated lesion left lung;  Underwent L 4 process Biopsy 5-30. Pathology "POORLY DIFFERENTIATED NON-SMALL CELL CARCINOMA", molecular testing pending, appreciate oncology  input  3-Post obstructive PNA;  Levaquin, finished total of 5 days treatment, no cough, no fever.   4-PE/DVT; was on IV heparin, now transitioned to xarelto per oncology recommendation  Constipation: stool softener.   Screening for HIV , negative.   DVT prophylaxis: Heparin , now xarelto Code Status: Full code.  Family Communication: Mother and sister at bedside.  Disposition Plan: plan home in 1-2 days, once patient has adequate pain control  Consultants:   Oncology Dr Benay Spice   Radiation oncology   Phone conversation with spine surgeon  Procedures:  Ct guided bone biopsy on 5/30  Antimicrobials:  levaquin from 5/29 to 6/2   Subjective:  reported pain is better, states he tired to get up but only was able to stand up for 20-30 seconds due to pain, think scheduled neurontin and flexeril has helped,   Objective: Filed Vitals:   10/14/15 1808 10/14/15 2056 10/15/15 0243 10/15/15 0538  BP: 132/87 138/84 129/81 106/69  Pulse: 91 100 71 76  Temp: 98.9 F (37.2 C) 99.6 F (37.6 C) 98.4 F (36.9 C) 97.6 F (36.4 C)  TempSrc: Oral Oral Oral Oral  Resp: '18 16 16 14  '$ Height:      Weight:      SpO2: 96% 96% 98% 99%    Intake/Output Summary (Last 24 hours) at 10/15/15 1021 Last data filed at 10/14/15 1809  Gross per 24 hour  Intake    240 ml  Output    400 ml  Net   -160 ml   Filed Weights   10/09/15 1828 10/09/15 2208  Weight: 81.647 kg (180 lb) 82.4 kg (181 lb 10.5 oz)    Examination:  General exam: Appears calm and comfortable  Respiratory system: Clear to auscultation. Respiratory effort normal. Cardiovascular system: S1 & S2 heard, RRR. No JVD, murmurs, rubs, gallops or clicks. No pedal edema. Gastrointestinal system: Abdomen is nondistended, soft and nontender. No organomegaly or masses felt. Normal bowel sounds heard. Central nervous system: Alert and oriented. No focal neurological deficits. Difficulty raising B/L LE due to severe pain.   Extremities: Symmetric 5 x 5 power. Skin: No rashes, lesions or ulcers     Data Reviewed: I have personally reviewed following labs and imaging studies  CBC:  Recent Labs Lab 10/09/15 1914 10/09/15 2000 10/10/15 0340 10/11/15 0415 10/12/15 0044  WBC  --  15.0* 11.8* 17.7* 11.8*  NEUTROABS  --  11.8*  --   --   --   HGB 14.6 14.9 14.3 13.0 13.4  HCT 43.0 40.9 39.5 37.4* 38.4*  MCV  --  84.0 83.7 84.0 85.0  PLT  --  241 247 241 176   Basic Metabolic Panel:  Recent Labs Lab 10/09/15 1914 10/09/15 2000 10/10/15 0340 10/11/15 0415 10/12/15 0044  NA 140 140 140 139 139  K 4.3 4.3 4.4 4.4 3.8  CL 99* 101 102 103 101  CO2  --  '30 28 27 29  '$ GLUCOSE 108* 137* 171* 116* 120*  BUN 33* 28* 32* 26* 24*  CREATININE 1.10 1.04 1.13 0.93 0.93  CALCIUM  --  9.7 9.5 8.9 8.7*   GFR: Estimated Creatinine Clearance: 121.7 mL/min (by C-G formula based on Cr of 0.93). Liver Function Tests:  Recent Labs Lab 10/09/15 2000 10/11/15 0415  10/12/15 0044  AST 33 23 23  ALT '31 29 25  '$ ALKPHOS 200* 171* 186*  BILITOT 0.7 0.5 0.4  PROT 6.9 5.9* 6.0*  ALBUMIN 4.0 3.4* 3.3*   No results for input(s): LIPASE, AMYLASE in the last 168 hours. No results for input(s): AMMONIA in the last 168 hours. Coagulation Profile:  Recent Labs Lab 10/10/15 1538  INR 1.18   Cardiac Enzymes: No results for input(s): CKTOTAL, CKMB, CKMBINDEX, TROPONINI in the last 168 hours. BNP (last 3 results) No results for input(s): PROBNP in the last 8760 hours. HbA1C: No results for input(s): HGBA1C in the last 72 hours. CBG: No results for input(s): GLUCAP in the last 168 hours. Lipid Profile: No results for input(s): CHOL, HDL, LDLCALC, TRIG, CHOLHDL, LDLDIRECT in the last 72 hours. Thyroid Function Tests: No results for input(s): TSH, T4TOTAL, FREET4, T3FREE, THYROIDAB in the last 72 hours. Anemia Panel: No results for input(s): VITAMINB12, FOLATE, FERRITIN, TIBC, IRON, RETICCTPCT in the last 72  hours. Sepsis Labs: No results for input(s): PROCALCITON, LATICACIDVEN in the last 168 hours.  No results found for this or any previous visit (from the past 240 hour(s)).       Radiology Studies: No results found.      Scheduled Meds: . cyclobenzaprine  5 mg Oral BID  . dexamethasone  4 mg Oral BID  . feeding supplement (ENSURE ENLIVE)  237 mL Oral BID BM  . gabapentin  200 mg Oral TID  . loratadine  10 mg Oral QPM  . morphine  30 mg Oral Q12H  . polyethylene glycol  17 g Oral Daily  . Rivaroxaban  15 mg Oral BID WC  . [START ON 11/03/2015] rivaroxaban  20 mg Oral Q supper  . senna-docusate  2 tablet Oral BID  . sodium chloride flush  3 mL Intravenous Q12H   Continuous Infusions: . sodium chloride Stopped (10/14/15 1104)     LOS: 5 days    Time spent: 25 minutes.     Florencia Reasons, MD PhD [facility] Pager [phone]  If 7PM-7AM, please contact night-coverage [email or web address] Password [email or web address] 10/15/2015, 10:21 AM

## 2015-10-16 MED ORDER — CYCLOBENZAPRINE HCL 5 MG PO TABS: 5.0000 mg | ORAL_TABLET | Freq: Two times a day (BID) | ORAL | Status: DC

## 2015-10-16 MED ORDER — DEXAMETHASONE 4 MG PO TABS: 4.0000 mg | ORAL_TABLET | Freq: Two times a day (BID) | ORAL | Status: DC

## 2015-10-16 MED ORDER — GABAPENTIN 100 MG PO CAPS: 200.0000 mg | ORAL_CAPSULE | Freq: Three times a day (TID) | ORAL | Status: DC

## 2015-10-16 MED ORDER — OXYCODONE HCL 5 MG PO TABS: 5.0000 mg | ORAL_TABLET | ORAL | Status: DC | PRN

## 2015-10-16 MED ORDER — MORPHINE SULFATE ER 30 MG PO TBCR: 30.0000 mg | EXTENDED_RELEASE_TABLET | Freq: Two times a day (BID) | ORAL | Status: DC

## 2015-10-16 MED ORDER — ENSURE ENLIVE PO LIQD
237.0000 mL | Freq: Two times a day (BID) | ORAL | Status: AC
Start: 2015-10-16 — End: ?

## 2015-10-16 MED ORDER — POLYETHYLENE GLYCOL 3350 17 G PO PACK: 17.0000 g | PACK | Freq: Every day | ORAL | Status: AC

## 2015-10-16 MED ORDER — MAGNESIUM HYDROXIDE 400 MG/5ML PO SUSP
15.0000 mL | Freq: Once | ORAL | Status: AC
  Administered 2015-10-16: 15 mL via ORAL
  Filled 2015-10-16: qty 30

## 2015-10-16 MED ORDER — SENNOSIDES-DOCUSATE SODIUM 8.6-50 MG PO TABS
2.0000 | ORAL_TABLET | Freq: Two times a day (BID) | ORAL | Status: AC
Start: 2015-10-16 — End: ?

## 2015-10-16 MED ORDER — RIVAROXABAN (XARELTO) VTE STARTER PACK (15 & 20 MG): ORAL_TABLET | ORAL | Status: DC

## 2015-10-16 NOTE — Progress Notes (Signed)
PROGRESS NOTE    Taylor Sanchez  QIO:[id number] DOB: 1975-08-03 DOA: 10/09/2015 PCP: Rob Hickman, family practice.    Brief Narrative: Taylor Sanchez is a 40 y.o. male who presents to the ED with complaints of severe 10/10 Low Back Pain worsening over the past 24 hours, with muscles spasms, and pain radiating down his Right leg. He had an MRI performed on 05/25 ( found on the [facility] ordered through [facility]), of which he was to follow up with his PCP on Tuesday 05/30 to go over the results. He was prescribed medications which had not relieved his pain. THe EDP discussed those results with patient and his mother who is at the bedside. The MRI reveals evidence of Lumbar metastatic Disease affecting the L4-L5 and L-5 -S-1). A Chest X-ray was performed and revealed findings suggestive of a Bronchogenic Carcinoma of the RUL. CT scan of the Chest and ABD were recommended, and he was referred for observation.    He reports that he has had pain since January which occurred after working out at [employer] and lifting heavy objects, and later the pain was exacerbated by rough housing with one of his friends. He denies any loss of bowel or bladder function. He reports not being able to stand and walk due to pain and spasms. He was evaluated in the ED and medicated with IV Toradol followed by doses of IV Robaxin, IV Dexamethasone, and IV Dilaudid which gave him mild relief.   Admitted with metastatic cancer to lumbar spine. CT with post obstructive PNA, possible spiculated lesion left  Lung. He Underwent L 4 process Biopsy 5-30. Pathology pending. Also Radiation oncologist consulted for radiation treatment for lumbar spine lesions.   Assessment & Plan:   Principal Problem:   Intractable back pain Active Problems:   Lumbar spine tumor   Right-sided low back pain with right-sided sciatica   Lung mass   Leukocytosis   Bony metastasis (HCC)   Compression fracture of lumbar  vertebra (HCC)   Metastasis to spinal cord with unknown primary site [facility])   Pulmonary embolism (HCC)   Cancer (HCC)  1-Severe Back pain: Metastatic Diseases Lumbar spine, compression fracture.  Patient had MRI 10-06-2015 done outside facility: Signal abnormality involving lumbar vertebra and sacrum as well as posterior elements that overall appears concerning for metastatic malignancy. 2. L4 vertebra appears expanded posteriorly towards the spinal canal likely secondary to tumor infiltration. There is loss of height of L4 vertebral concerning for pathologic compression fracture. There is depression of superior endplates of L3 and inferior endplate of L3, which could be related to Schmorls nodes, although pathologic compression fractures at these sites not excluded. 3. Abnormal soft tissue on the left at L3-L4 concerning for areas of a tumor infiltration with additional abnormal soft tissue surrounding the posterior elements of lower lumbar spine concerning for areas of tumor infiltration. 4. Transitional anatomy suspected with likely sacralization of L5 vertebra as described in prior plain film report.   -i have talked to spine surgery who states that patient can bear weight and may benefit from back brace, currently patient does not think back brace will help, he still refuse to work with physical therapy due to pain -continue XRT, steroids, pain control, continue adjust opioids analgesics, Schedule low dose neurontin/flexeril and titrate -6/3 reported pain is better, states he tired to get up but only was able to stand up for 20-30 seconds due to pain, think scheduled neurontin and flexeril has helped, will increase dose, anticipate discharge  in am with home health    2-Multiple lung nodularity right lung, possible spiculated lesion left lung;  Underwent L 4 process Biopsy 5-30. Pathology "POORLY DIFFERENTIATED NON-SMALL CELL CARCINOMA", molecular testing pending, appreciate oncology  input  3-Post obstructive PNA;  Levaquin, finished total of 5 days treatment, no cough, no fever.   4-PE/DVT; was on IV heparin, now transitioned to xarelto per oncology recommendation  Constipation: stool softener.   Screening for HIV , negative.   DVT prophylaxis: Heparin , now xarelto Code Status: Full code.  Family Communication: Mother and sister at bedside.  Disposition Plan: plan home in 1-2 days, once patient has adequate pain control  Consultants:   Oncology Dr Benay Spice   Radiation oncology   Phone conversation with spine surgeon  Procedures:  Ct guided bone biopsy on 5/30  Antimicrobials:  levaquin from 5/29 to 6/2   Subjective:  reported pain is better, , think scheduled neurontin and flexeril has helped, able to ambulate, but have problem bending and sitting, worried not able to get in and out of the car, mother in room,   no bm,   Objective: Filed Vitals:   10/15/15 0538 10/15/15 1330 10/15/15 2041 10/16/15 0603  BP: 106/69 117/78 127/80 108/61  Pulse: 76 85 89 75  Temp: 97.6 F (36.4 C) 98.4 F (36.9 C) 99.4 F (37.4 C) 98.3 F (36.8 C)  TempSrc: Oral Oral Oral Oral  Resp: '14 16 16 16  '$ Height:      Weight:      SpO2: 99% 99% 99% 98%    Intake/Output Summary (Last 24 hours) at 10/16/15 0844 Last data filed at 10/15/15 2315  Gross per 24 hour  Intake   1560 ml  Output   2225 ml  Net   -665 ml   Filed Weights   10/09/15 1828 10/09/15 2208  Weight: 81.647 kg (180 lb) 82.4 kg (181 lb 10.5 oz)    Examination:  General exam: Appears calm and comfortable  Respiratory system: Clear to auscultation. Respiratory effort normal. Cardiovascular system: S1 & S2 heard, RRR. No JVD, murmurs, rubs, gallops or clicks. No pedal edema. Gastrointestinal system: Abdomen is nondistended, soft and nontender. No organomegaly or masses felt. Normal bowel sounds heard. Central nervous system: Alert and oriented. No focal neurological deficits. Difficulty  raising B/L LE due to severe pain.  Extremities: Symmetric 5 x 5 power. Skin: No rashes, lesions or ulcers     Data Reviewed: I have personally reviewed following labs and imaging studies  CBC:  Recent Labs Lab 10/09/15 1914 10/09/15 2000 10/10/15 0340 10/11/15 0415 10/12/15 0044  WBC  --  15.0* 11.8* 17.7* 11.8*  NEUTROABS  --  11.8*  --   --   --   HGB 14.6 14.9 14.3 13.0 13.4  HCT 43.0 40.9 39.5 37.4* 38.4*  MCV  --  84.0 83.7 84.0 85.0  PLT  --  241 247 241 578   Basic Metabolic Panel:  Recent Labs Lab 10/09/15 1914 10/09/15 2000 10/10/15 0340 10/11/15 0415 10/12/15 0044  NA 140 140 140 139 139  K 4.3 4.3 4.4 4.4 3.8  CL 99* 101 102 103 101  CO2  --  '30 28 27 29  '$ GLUCOSE 108* 137* 171* 116* 120*  BUN 33* 28* 32* 26* 24*  CREATININE 1.10 1.04 1.13 0.93 0.93  CALCIUM  --  9.7 9.5 8.9 8.7*   GFR: Estimated Creatinine Clearance: 121.7 mL/min (by C-G formula based on Cr of 0.93). Liver Function Tests:  Recent Labs Lab 10/09/15 2000 10/11/15 0415 10/12/15 0044  AST 33 23 23  ALT '31 29 25  '$ ALKPHOS 200* 171* 186*  BILITOT 0.7 0.5 0.4  PROT 6.9 5.9* 6.0*  ALBUMIN 4.0 3.4* 3.3*   No results for input(s): LIPASE, AMYLASE in the last 168 hours. No results for input(s): AMMONIA in the last 168 hours. Coagulation Profile:  Recent Labs Lab 10/10/15 1538  INR 1.18   Cardiac Enzymes: No results for input(s): CKTOTAL, CKMB, CKMBINDEX, TROPONINI in the last 168 hours. BNP (last 3 results) No results for input(s): PROBNP in the last 8760 hours. HbA1C: No results for input(s): HGBA1C in the last 72 hours. CBG: No results for input(s): GLUCAP in the last 168 hours. Lipid Profile: No results for input(s): CHOL, HDL, LDLCALC, TRIG, CHOLHDL, LDLDIRECT in the last 72 hours. Thyroid Function Tests: No results for input(s): TSH, T4TOTAL, FREET4, T3FREE, THYROIDAB in the last 72 hours. Anemia Panel: No results for input(s): VITAMINB12, FOLATE, FERRITIN, TIBC,  IRON, RETICCTPCT in the last 72 hours. Sepsis Labs: No results for input(s): PROCALCITON, LATICACIDVEN in the last 168 hours.  No results found for this or any previous visit (from the past 240 hour(s)).       Radiology Studies: No results found.      Scheduled Meds: . cyclobenzaprine  5 mg Oral BID  . dexamethasone  4 mg Oral BID  . feeding supplement (ENSURE ENLIVE)  237 mL Oral BID BM  . gabapentin  200 mg Oral TID  . loratadine  10 mg Oral QPM  . magnesium hydroxide  15 mL Oral Once  . morphine  30 mg Oral Q12H  . polyethylene glycol  17 g Oral Daily  . Rivaroxaban  15 mg Oral BID WC  . [START ON 11/03/2015] rivaroxaban  20 mg Oral Q supper  . senna-docusate  2 tablet Oral BID  . sodium chloride flush  3 mL Intravenous Q12H   Continuous Infusions: . sodium chloride Stopped (10/14/15 1104)     LOS: 6 days    Time spent: 15 minutes.     Florencia Reasons, MD PhD [facility] Pager [phone]  If 7PM-7AM, please contact night-coverage [email or web address] Password [facility] 10/16/2015, 8:44 AM

## 2015-10-16 NOTE — Progress Notes (Signed)
ANTICOAGULATION CONSULT NOTE - Follow Up Consult  Pharmacy Consult for Xarelto Indication: pulmonary embolus, RLE DVT  Allergies  Allergen Reactions  . Bee Venom   . Penicillins Hives    Has patient had a PCN reaction causing immediate rash, facial/tongue/throat swelling, SOB or lightheadedness with hypotension: No Has patient had a PCN reaction causing severe rash involving mucus membranes or skin necrosis: No Has patient had a PCN reaction that required hospitalization No Has patient had a PCN reaction occurring within the last 10 years: No If all of the above answers are "NO", then may proceed with Cephalosporin use.   . Prednisone Rash    Had jock itch and it made it spread and made it worse   Patient Measurements: Height: _0  (185.4 cm) Weight: 181 lb 10.5 oz (82.4 kg) IBW/kg (Calculated) : 79.9  Vital Signs: Temp: 98.3 F (36.8 C) (06/04 0603) Temp Source: Oral (06/04 0603) BP: 108/61 mmHg (06/04 0603) Pulse Rate: 75 (06/04 0603)  Labs: No results for input(s): HGB, HCT, PLT, APTT, LABPROT, INR, HEPARINUNFRC, HEPRLOWMOCWT, CREATININE, CKTOTAL, CKMB, TROPONINI in the last 72 hours.  Estimated Creatinine Clearance: 121.7 mL/min (by C-G formula based on Cr of 0.93).  Medications:  Infusions:  . sodium chloride Stopped (10/14/15 1104)   Assessment: 40 year old male admitted on 5/28 with severe lower back pain. MRI consistent with multiple bone mets, concern for lung cancer, plan biopsy. CT shows small acute pulmonary embolus in lower lobe branch of right pulmonary artery. Pharmacy consulted to dose heparin IV. Doppler positive for RLE DVT.  Lumber met biopsy 5/30  Onc MD recommended change to Xarelto 5/31  6/1: Heparin discontinued, begin Xarelto  Goal of Therapy:  Monitor platelets by anticoagulation protocol: Yes   Plan:   Continue Xarelto 24m bid with meals x 21 days, followed by 2101mdaily (starting on 6/22)  Monitor sign/sx bleeding, CBC  Xarelto  education completed 6/2  AmHershal CoriaPharmD, BCPS Pager: [phone]/08/2015 8:52 AM

## 2015-10-16 NOTE — Progress Notes (Signed)
Provided pt with Xarelto 30 day free trial card and $0 copay card. Explained to pt how to use cards at his pharmacy. Contacted [facility] DME rep for delivery of RW and 3n1 to the room. Sent benefits check to see if prior auth needed. NCM will continue to follow up for dc needs. Jonnie Finner RN CCM Case Mgmt phone [phone]

## 2015-10-17 ENCOUNTER — Other Ambulatory Visit: Payer: Self-pay | Admitting: *Deleted

## 2015-10-17 ENCOUNTER — Telehealth: Payer: Self-pay | Admitting: Nurse Practitioner

## 2015-10-17 ENCOUNTER — Ambulatory Visit
Admit: 2015-10-17 | Discharge: 2015-10-17 | Disposition: A | Discharge status: Home / Self Care | Payer: BC Managed Care – PPO | Attending: Radiation Oncology | Admitting: Radiation Oncology

## 2015-10-17 ENCOUNTER — Encounter: Payer: Self-pay | Admitting: Radiation Oncology

## 2015-10-17 VITALS — BP 126/81 | HR 86 | Temp 98.3°F | Resp 18 | Ht 73.0 in

## 2015-10-17 DIAGNOSIS — C7951 Secondary malignant neoplasm of bone: Secondary | ICD-10-CM

## 2015-10-17 NOTE — Progress Notes (Signed)
Taylor Sanchez has received 4 fractions to his  Spine.  Skin to L spine with normal color.  Appetite is good.  Denies fatigue except when he gets up to bathroom gets fatigue.  Pain 1/10 taking MS Contin, Oxycodone and Tramadol.  No change in bowwel and bladder pattern. BP 126/81 mmHg  Pulse 86  Temp(Src) 98.3 F (36.8 C) (Oral)  Resp 18  Ht '6\' 1"'$  (1.854 m)  SpO2 97%

## 2015-10-17 NOTE — Telephone Encounter (Signed)
Pt will get sched in hospital on discharge papers per dr. Merdis Delay nurse

## 2015-10-17 NOTE — Discharge Summary (Signed)
Discharge Summary  Taylor Sanchez XTG:[id number] DOB: [birth year]  PCP: No primary care provider on file.  Admit date: 10/09/2015 Discharge date: 10/17/2015  Time spent: <32mns  Recommendations for Outpatient Follow-up:  1. F/u with oncology Dr sLearta Coddingin a week 2. Continue follow up with radiation oncology 3. Home health arranged  Discharge Diagnoses:  Active Hospital Problems   Diagnosis Date Noted  . Intractable back pain 10/09/2015  . Cancer ([facility]   . Bony metastasis ([facility]   . Compression fracture of lumbar vertebra ([facility] [place]   . Metastasis to spinal cord with unknown primary site ([facility]   . Pulmonary embolism ([facility]   . Lumbar spine tumor 10/09/2015  . Right-sided low back pain with right-sided sciatica 10/09/2015  . Lung mass 10/09/2015  . Leukocytosis 10/09/2015    Resolved Hospital Problems   Diagnosis Date Noted Date Resolved  No resolved problems to display.    Discharge Condition: stable  Diet recommendation: regular diet  Filed Weights   10/09/15 1828 10/09/15 2208  Weight: 81.647 kg (180 lb) 82.4 kg (181 lb 10.5 oz)    History of present illness:  Chief Complaint: Severe Low Back Pain  HPI: Taylor Munfordis a 40y.o. male who presents to the ED with complaints of severe 10/10 Low Back Pain worsening over the past 24 hours, with muscles spasms, and pain radiating down his Right leg. He had an MRI performed on 05/25 ( [facility] of which he was to follow up with his PCP on Tuesday 05/30 to go over the results. He was prescribed medications which had not relieved his pain. THe EDP discussed those results with patient and his mother who is at the bedside. The MRI reveals evidence of Lumbar metastatic Disease affecting the L4-L5 and L-5 -S-1). A Chest X-ray was performed and revealed findings suggestive of a Bronchogenic Carcinoma of the RUL. CT scan of the Chest and ABD were recommended, and he was  referred for observation.    He reports that he has had pain since January which occurred after working out at [place] heavy objects, and later the pain was exacerbated by rough housing with one of his friends. He denies any loss of bowel or bladder function. He reports not being able to stand and walk due to pain and spasms. He was evaluated in the ED and medicated with IV Toradol followed by doses of IV Robaxin, IV Dexamethasone, and IV Dilaudid which gave him mild relief.   CT with post obstructive PNA, possible spiculated lesion left Lung. He Underwent L 4 process Biopsy 5-30. Pathology + adenocarcinoma. Molecular study pending. Radiation oncologist consulted for radiation treatment for lumbar spine lesions. Oncology consulted.  Hospital Course:  Principal Problem:   Intractable back pain Active Problems:   Lumbar spine tumor   Right-sided low back pain with right-sided sciatica   Lung mass   Leukocytosis   Bony metastasis (HCC)   Compression fracture of lumbar vertebra (HCC)   Metastasis to spinal cord with unknown primary site ([facility]   Pulmonary embolism (HCC)   Cancer (HCC)   1-Severe Back pain: Metastatic Diseases Lumbar spine, compression fracture.  Patient had MRI 10-06-2015 done outside facility: Signal abnormality involving lumbar vertebra and sacrum as well as posterior elements that overall appears concerning for metastatic malignancy. 2. L4 vertebra appears expanded posteriorly towards the spinal canal likely secondary to tumor infiltration. There is loss of height of L4 vertebral concerning for pathologic compression  fracture. There is depression of superior endplates of L3 and inferior endplate of L3, which could be related to Schmorls nodes, although pathologic compression fractures at these sites not excluded. 3. Abnormal soft tissue on the left at L3-L4 concerning for areas of a tumor infiltration with additional abnormal soft tissue surrounding  the posterior elements of lower lumbar spine concerning for areas of tumor infiltration. 4. Transitional anatomy suspected with likely sacralization of L5 vertebra as described in prior plain film report.  -i have talked to spine surgery who states that patient can bear weight and may benefit from back brace, currently patient does not think back brace will help, he still refuse to work with physical therapy due to pain -continue XRT, steroids, pain control, continue adjust opioids analgesics, Schedule low dose neurontin/flexeril and titrate -6/3 reported pain is better, states he tired to get up but only was able to stand up for 20-30 seconds due to pain, think scheduled neurontin and flexeril has helped, increase dose, -6/5 pain better controlled, able to ambulate with a walker,  discharge home with home health,  he is discharge on steroids/opioids analgesics/neurontin/flexeril, continue outpatient xrt, and close follow up with oncology.    2-Multiple lung nodularity right lung, possible spiculated lesion left lung;  Underwent L 4 process Biopsy 5-30. Pathology "POORLY DIFFERENTIATED NON-SMALL CELL CARCINOMA", molecular testing pending, appreciate oncology input  3-Post obstructive PNA; Levaquin, finished total of 5 days treatment, no cough, no fever.   4-PE/DVT; was on IV heparin, now transitioned to xarelto per oncology recommendation  Constipation: stool softener.   Screening for HIV , negative.   DVT prophylaxis: Heparin , now xarelto Code Status: Full code.  Family Communication: Mother at bedside.  Disposition Plan: home 6/5  Consultants:   Oncology Dr Benay Spice   Radiation oncology   Phone conversation with spine surgeon  Procedures:  Ct guided bone biopsy on 5/30  Antimicrobials:  levaquin from 5/29 to 6/2   Discharge Exam: BP 118/77 mmHg  Pulse 80  Temp(Src) 98.4 F (36.9 C) (Oral)  Resp 16  Ht '6\' 1"'$  (1.854 m)  Wt 82.4 kg (181 lb 10.5 oz)  BMI  23.97 kg/m2  SpO2 100%  General: * Cardiovascular: * Respiratory: *  Discharge Instructions You were cared for by a hospitalist during your hospital stay. If you have any questions about your discharge medications or the care you received while you were in the hospital after you are discharged, you can call the unit and asked to speak with the hospitalist on call if the hospitalist that took care of you is not available. Once you are discharged, your primary care physician will handle any further medical issues. Please note that NO REFILLS for any discharge medications will be authorized once you are discharged, as it is imperative that you return to your primary care physician (or establish a relationship with a primary care physician if you do not have one) for your aftercare needs so that they can reassess your need for medications and monitor your lab values.      Discharge Instructions    Discharge instructions    Complete by:  As directed   Regular diet     Face-to-face encounter (required for Medicare/Medicaid patients)    Complete by:  As directed   I Tomer Chalmers certify that this patient is under my care and that I, or a nurse practitioner or physician's assistant working with me, had a face-to-face encounter that meets the physician face-to-face encounter requirements with this  patient on 10/16/2015. The encounter with the patient was in whole, or in part for the following medical condition(s) which is the primary reason for home health care (List medical condition): FTT  The encounter with the patient was in whole, or in part, for the following medical condition, which is the primary reason for home health care:  FTT  I certify that, based on my findings, the following services are medically necessary home health services:  Physical therapy  Reason for Medically Necessary Home Health Services:  Therapy- Therapeutic Exercises to Increase Strength and Endurance  My clinical findings support  the need for the above services:  Pain interferes with ambulation/mobility  Further, I certify that my clinical findings support that this patient is homebound due to:  Pain interferes with ambulation/mobility     Home Health    Complete by:  As directed   To provide the following care/treatments:   PT OT       Increase activity slowly    Complete by:  As directed             Medication List    STOP taking these medications        diclofenac 50 MG EC tablet  Commonly known as:  VOLTAREN      TAKE these medications        cyclobenzaprine 5 MG tablet  Commonly known as:  FLEXERIL  Take 1 tablet (5 mg total) by mouth 2 (two) times daily.     dexamethasone 4 MG tablet  Commonly known as:  DECADRON  Take 1 tablet (4 mg total) by mouth 2 (two) times daily at 10 AM and 5 PM.     feeding supplement (ENSURE ENLIVE) Liqd  Take 237 mLs by mouth 2 (two) times daily between meals.     gabapentin 100 MG capsule  Commonly known as:  NEURONTIN  Take 2 capsules (200 mg total) by mouth 3 (three) times daily.     levocetirizine 5 MG tablet  Commonly known as:  XYZAL  Take 5 mg by mouth every evening.     morphine 30 MG 12 hr tablet  Commonly known as:  MS CONTIN  Take 1 tablet (30 mg total) by mouth every 12 (twelve) hours.     MULTIVITAMIN ADULT PO  Take 1 capsule by mouth daily.     oxyCODONE 5 MG immediate release tablet  Commonly known as:  Oxy IR/ROXICODONE  Take 1-2 tablets (5-10 mg total) by mouth every 4 (four) hours as needed for moderate pain.     polyethylene glycol packet  Commonly known as:  MIRALAX / GLYCOLAX  Take 17 g by mouth daily.     Rivaroxaban 15 & 20 MG Tbpk  Take as directed on package: Start with one '15mg'$  tablet by mouth twice a day with food. On Day 22, switch to one '20mg'$  tablet once a day with food.     senna-docusate 8.6-50 MG tablet  Commonly known as:  Senokot-S  Take 2 tablets by mouth 2 (two) times daily.     traMADol 50 MG tablet    Commonly known as:  ULTRAM  Take 50 mg by mouth 2 (two) times daily.       Allergies  Allergen Reactions  . Bee Venom   . Penicillins Hives    Has patient had a PCN reaction causing immediate rash, facial/tongue/throat swelling, SOB or lightheadedness with hypotension: No Has patient had a PCN reaction causing severe rash involving mucus membranes  or skin necrosis: No Has patient had a PCN reaction that required hospitalization No Has patient had a PCN reaction occurring within the last 10 years: No If all of the above answers are "NO", then may proceed with Cephalosporin use.   . Prednisone Rash    Had jock itch and it made it spread and made it worse   Follow-up Information    Follow up with continue radiation therapy as scheduled In 1 week.   Why:  hospital discharge follow up       The results of significant diagnostics from this hospitalization (including imaging, microbiology, ancillary and laboratory) are listed below for reference.    Significant Diagnostic Studies: Dg Chest 2 View  10/09/2015  CLINICAL DATA:  40 year old male with new diagnosis of metastatic disease to the spine. Smoker. No known primary malignancy. EXAM: CHEST  2 VIEW COMPARISON:  No priors. FINDINGS: Soft tissue fullness near the apex of the right upper lobe. Small nodular densities also project over the left upper lobe and right base. None of these demonstrate definitive correlate opacities on the lateral projections. No pleural effusions. No pneumothorax. No evidence of pulmonary edema. Heart size is normal. Fullness in the left hilar region. Soft tissue fullness also noted throughout the paratracheal soft tissues in the upper mediastinum. IMPRESSION: 1. Soft tissue fullness in the right upper lobe near the apex, and additional nodular opacities throughout the lungs bilaterally. Given the patient's history of recently diagnosed metastatic disease to the spine, further evaluation with contrast enhanced  chest CT is recommended in the near future to better evaluate these findings, as primary bronchogenic neoplasm in the right upper lobe is not excluded. 2. Contrast-enhanced chest CT will also be better able to evaluate the left hilar bilateral paratracheal fullness. Electronically Signed   By: Vinnie Langton M.D.   On: 10/09/2015 20:31   Ct Chest W Contrast  10/10/2015  CLINICAL DATA:  Spinal metastatic lesion.  Pulmonary nodules. EXAM: CT CHEST, ABDOMEN, AND PELVIS WITH CONTRAST TECHNIQUE: Multidetector CT imaging of the chest, abdomen and pelvis was performed following the standard protocol during bolus administration of intravenous contrast. CONTRAST:  150m ISOVUE-300 IOPAMIDOL (ISOVUE-300) INJECTION 61% COMPARISON:  Chest radiograph Oct 09, 2015. FINDINGS: CT CHEST Expansile lytic lesion is noted involving the lateral portion of the right ninth rib consistent with metastatic lesion. Also noted is lytic expansile lesion involving the posterior portion of the left eighth rib. No pneumothorax or pleural effusion is noted. Consolidation is noted in the superior segment of the left lower lobe concerning for postobstructive pneumonia. 17 x 17 mm spiculated lesion is seen in this area concerning for malignancy. Multiple nodular densities are noted posteriorly in the right lower lobe with the largest measuring 18 x 13 mm. These are most consistent with metastatic disease given the other findings present. There is no evidence of thoracic aortic dissection or aneurysm. There is noted a filling defect in a lower lobe branch of the right pulmonary artery consistent with pulmonary embolus. Right hilar adenopathy is noted, with the largest lymph node measuring 26 x 16 mm. Right peritracheal lymph node measuring 13 x 10 mm is noted. Sub carinal lymph node measuring 21 x 10 mm is noted. 14 x 10 mm lymph node is noted in aortopulmonary window. CT ABDOMEN AND PELVIS Lytic destruction is seen involving the L4 and L5  vertebral bodies, as well as the posterior elements of L5. Also noted is a lytic lesion in the L1 vertebral body. These are  concerning for metastatic disease. Large lytic destructive lesion is seen in the right iliac wing. No gallstones are noted. The liver, spleen and pancreas are unremarkable. Adrenal glands and kidneys appear normal. No hydronephrosis or renal obstruction is noted. The appendix appears normal. There is no evidence of bowel obstruction. Urinary bladder appears normal. Mildly enlarged left periaortic lymph nodes are noted, as well as 14 x 13 mm left common iliac lymph node concerning for metastatic disease. IMPRESSION: Osseous metastatic lesions are noted in the bilateral ribs, lumbar spine and right iliac bone. Mediastinal adenopathy is noted as described above, concerning for metastatic disease. Multiple nodular densities noted posteriorly in right lower lobe concerning for metastatic disease. Probable post obstructive pneumonia or atelectasis is noted in the superior segment of the left lower lobe. There is the suggestion of a spiculated lesion in this area measuring 17 mm concerning for malignancy or metastatic disease. Enlarged left periaortic and common iliac adenopathy is noted concerning for metastatic disease. Small acute pulmonary embolus is noted in lower lobe branch of right pulmonary artery. Critical Value/emergent results were called by telephone at the time of interpretation on 10/10/2015 at 3:18 pm to Dr. Niel Hummer , who verbally acknowledged these results. Electronically Signed   By: Marijo Conception, M.D.   On: 10/10/2015 15:19   Ct Abdomen Pelvis W Contrast  10/10/2015  CLINICAL DATA:  Spinal metastatic lesion.  Pulmonary nodules. EXAM: CT CHEST, ABDOMEN, AND PELVIS WITH CONTRAST TECHNIQUE: Multidetector CT imaging of the chest, abdomen and pelvis was performed following the standard protocol during bolus administration of intravenous contrast. CONTRAST:  156m ISOVUE-300  IOPAMIDOL (ISOVUE-300) INJECTION 61% COMPARISON:  Chest radiograph Oct 09, 2015. FINDINGS: CT CHEST Expansile lytic lesion is noted involving the lateral portion of the right ninth rib consistent with metastatic lesion. Also noted is lytic expansile lesion involving the posterior portion of the left eighth rib. No pneumothorax or pleural effusion is noted. Consolidation is noted in the superior segment of the left lower lobe concerning for postobstructive pneumonia. 17 x 17 mm spiculated lesion is seen in this area concerning for malignancy. Multiple nodular densities are noted posteriorly in the right lower lobe with the largest measuring 18 x 13 mm. These are most consistent with metastatic disease given the other findings present. There is no evidence of thoracic aortic dissection or aneurysm. There is noted a filling defect in a lower lobe branch of the right pulmonary artery consistent with pulmonary embolus. Right hilar adenopathy is noted, with the largest lymph node measuring 26 x 16 mm. Right peritracheal lymph node measuring 13 x 10 mm is noted. Sub carinal lymph node measuring 21 x 10 mm is noted. 14 x 10 mm lymph node is noted in aortopulmonary window. CT ABDOMEN AND PELVIS Lytic destruction is seen involving the L4 and L5 vertebral bodies, as well as the posterior elements of L5. Also noted is a lytic lesion in the L1 vertebral body. These are concerning for metastatic disease. Large lytic destructive lesion is seen in the right iliac wing. No gallstones are noted. The liver, spleen and pancreas are unremarkable. Adrenal glands and kidneys appear normal. No hydronephrosis or renal obstruction is noted. The appendix appears normal. There is no evidence of bowel obstruction. Urinary bladder appears normal. Mildly enlarged left periaortic lymph nodes are noted, as well as 14 x 13 mm left common iliac lymph node concerning for metastatic disease. IMPRESSION: Osseous metastatic lesions are noted in the  bilateral ribs, lumbar spine and right  iliac bone. Mediastinal adenopathy is noted as described above, concerning for metastatic disease. Multiple nodular densities noted posteriorly in right lower lobe concerning for metastatic disease. Probable post obstructive pneumonia or atelectasis is noted in the superior segment of the left lower lobe. There is the suggestion of a spiculated lesion in this area measuring 17 mm concerning for malignancy or metastatic disease. Enlarged left periaortic and common iliac adenopathy is noted concerning for metastatic disease. Small acute pulmonary embolus is noted in lower lobe branch of right pulmonary artery. Critical Value/emergent results were called by telephone at the time of interpretation on 10/10/2015 at 3:18 pm to Dr. Niel Hummer , who verbally acknowledged these results. Electronically Signed   By: Marijo Conception, M.D.   On: 10/10/2015 15:19   Ct Biopsy  10/11/2015  INDICATION: Multiple bone lesions EXAM: CT BIOPSY MEDICATIONS: None. ANESTHESIA/SEDATION: Fentanyl 100 mcg IV; Versed to mg IV Moderate Sedation Time:  13 The patient was continuously monitored during the procedure by the interventional radiology nurse under my direct supervision. FLUOROSCOPY TIME:  Fluoroscopy Time:  minutes  seconds ( mGy). COMPLICATIONS: None immediate. PROCEDURE: Informed written consent was obtained from the patient after a thorough discussion of the procedural risks, benefits and alternatives. All questions were addressed. Maximal Sterile Barrier Technique was utilized including caps, mask, sterile gowns, sterile gloves, sterile drape, hand hygiene and skin antiseptic. A timeout was performed prior to the initiation of the procedure. Under CT guidance, a(n) 17 gauge guide needle was advanced into the left L4 transverse process bone lesion. Subsequently 2 18 gauge core biopsies were obtained. The guide needle was removed. Post biopsy images demonstrate no hemorrhage. Patient  tolerated the procedure well without complication. Vital sign monitoring by nursing staff during the procedure will continue as patient is in the special procedures unit for post procedure observation. The images document guide needle placement within the left L4 transverse process bone lesion. Post biopsy images demonstrate no hemorrhage. IMPRESSION: Successful CT-guided left L4 transverse process bone core biopsy. Electronically Signed   By: Marybelle Killings M.D.   On: 10/11/2015 17:16   Mr Outside Films Spine  10/10/2015  CLINICAL DATA:  This exam is stored here for comparison purposes only and was performed at an outside facility.   Please contact the originating institution for any associated interpretation or report.    Microbiology: No results found for this or any previous visit (from the past 240 hour(s)).   Labs: Basic Metabolic Panel:  Recent Labs Lab 10/11/15 0415 10/12/15 0044  NA 139 139  K 4.4 3.8  CL 103 101  CO2 27 29  GLUCOSE 116* 120*  BUN 26* 24*  CREATININE 0.93 0.93  CALCIUM 8.9 8.7*   Liver Function Tests:  Recent Labs Lab 10/11/15 0415 10/12/15 0044  AST 23 23  ALT 29 25  ALKPHOS 171* 186*  BILITOT 0.5 0.4  PROT 5.9* 6.0*  ALBUMIN 3.4* 3.3*   No results for input(s): LIPASE, AMYLASE in the last 168 hours. No results for input(s): AMMONIA in the last 168 hours. CBC:  Recent Labs Lab 10/11/15 0415 10/12/15 0044  WBC 17.7* 11.8*  HGB 13.0 13.4  HCT 37.4* 38.4*  MCV 84.0 85.0  PLT 241 223   Cardiac Enzymes: No results for input(s): CKTOTAL, CKMB, CKMBINDEX, TROPONINI in the last 168 hours. BNP: BNP (last 3 results) No results for input(s): BNP in the last 8760 hours.  ProBNP (last 3 results) No results for input(s): PROBNP in the last 8760  hours.  CBG: No results for input(s): GLUCAP in the last 168 hours.     SignedFlorencia Reasons MD, PhD  [facility] 10/17/2015, 7:00 PM

## 2015-10-17 NOTE — Progress Notes (Signed)
   Weekly Management Note:  inpatient    ICD-9-CM ICD-10-CM   1. Bony metastasis (HCC) 198.5 C79.51     Current Dose:  12 Gy  Projected Dose: 30 Gy   Narrative:  The patient presents for routine under treatment assessment.  CBCT/MVCT images/Port film x-rays were reviewed.  The chart was checked. Tolerating RT to L spine, no GI upset, pain is somewhat better  Physical Findings:  height is '6\' 1"'$  (1.854 m). His oral temperature is 98.3 F (36.8 C). His blood pressure is 126/81 and his pulse is 86. His respiration is 18 and oxygen saturation is 97%.   Wt Readings from Last 3 Encounters:  10/09/15 181 lb 10.5 oz (82.4 kg)   NAD, lying on gurney, A+O x 3  Impression:  The patient is tolerating radiotherapy.  Plan:  Continue radiotherapy as planned.    ________________________________   Eppie Gibson, M.D.

## 2015-10-18 ENCOUNTER — Ambulatory Visit
Admission: RE | Admit: 2015-10-18 | Discharge: 2015-10-18 | Disposition: A | Discharge status: Home / Self Care | Payer: BC Managed Care – PPO | Source: Ambulatory Visit | Attending: Radiation Oncology | Admitting: Radiation Oncology

## 2015-10-18 ENCOUNTER — Encounter: Payer: Self-pay | Admitting: Radiation Oncology

## 2015-10-18 DIAGNOSIS — Z510 Encounter for antineoplastic radiation therapy: Secondary | ICD-10-CM | POA: Diagnosis present

## 2015-10-18 DIAGNOSIS — C3490 Malignant neoplasm of unspecified part of unspecified bronchus or lung: Secondary | ICD-10-CM | POA: Insufficient documentation

## 2015-10-18 NOTE — Progress Notes (Signed)
Paperwork (FMLA) received 6/6, given to nurse 6/7

## 2015-10-19 ENCOUNTER — Ambulatory Visit
Admit: 2015-10-19 | Discharge: 2015-10-19 | Disposition: A | Discharge status: Home / Self Care | Payer: BC Managed Care – PPO | Attending: Radiation Oncology | Admitting: Radiation Oncology

## 2015-10-19 DIAGNOSIS — Z510 Encounter for antineoplastic radiation therapy: Secondary | ICD-10-CM | POA: Diagnosis not present

## 2015-10-20 ENCOUNTER — Ambulatory Visit
Admit: 2015-10-20 | Discharge: 2015-10-20 | Disposition: A | Discharge status: Home / Self Care | Payer: BC Managed Care – PPO | Attending: Radiation Oncology | Admitting: Radiation Oncology

## 2015-10-20 DIAGNOSIS — Z510 Encounter for antineoplastic radiation therapy: Secondary | ICD-10-CM | POA: Diagnosis not present

## 2015-10-21 ENCOUNTER — Ambulatory Visit
Admission: RE | Admit: 2015-10-21 | Discharge: 2015-10-21 | Disposition: A | Discharge status: Home / Self Care | Payer: BC Managed Care – PPO | Source: Ambulatory Visit | Attending: Radiation Oncology | Admitting: Radiation Oncology

## 2015-10-21 ENCOUNTER — Telehealth: Payer: Self-pay | Admitting: Oncology

## 2015-10-21 ENCOUNTER — Encounter: Payer: Self-pay | Admitting: Nurse Practitioner

## 2015-10-21 ENCOUNTER — Ambulatory Visit (HOSPITAL_BASED_OUTPATIENT_CLINIC_OR_DEPARTMENT_OTHER): Payer: BC Managed Care – PPO | Admitting: Nurse Practitioner

## 2015-10-21 ENCOUNTER — Telehealth: Payer: Self-pay

## 2015-10-21 VITALS — BP 108/78 | HR 80 | Temp 98.4°F | Resp 18 | Ht 73.0 in | Wt 178.2 lb

## 2015-10-21 DIAGNOSIS — C7949 Secondary malignant neoplasm of other parts of nervous system: Secondary | ICD-10-CM

## 2015-10-21 DIAGNOSIS — C3490 Malignant neoplasm of unspecified part of unspecified bronchus or lung: Secondary | ICD-10-CM

## 2015-10-21 DIAGNOSIS — C801 Malignant (primary) neoplasm, unspecified: Secondary | ICD-10-CM

## 2015-10-21 DIAGNOSIS — C7951 Secondary malignant neoplasm of bone: Secondary | ICD-10-CM | POA: Diagnosis not present

## 2015-10-21 DIAGNOSIS — Z510 Encounter for antineoplastic radiation therapy: Secondary | ICD-10-CM | POA: Diagnosis not present

## 2015-10-21 DIAGNOSIS — I2699 Other pulmonary embolism without acute cor pulmonale: Secondary | ICD-10-CM

## 2015-10-21 DIAGNOSIS — D492 Neoplasm of unspecified behavior of bone, soft tissue, and skin: Secondary | ICD-10-CM

## 2015-10-21 MED ORDER — MORPHINE SULFATE ER 30 MG PO TBCR: 30.0000 mg | EXTENDED_RELEASE_TABLET | Freq: Two times a day (BID) | ORAL | Status: DC

## 2015-10-21 MED ORDER — OXYCODONE HCL 5 MG PO TABS: 5.0000 mg | ORAL_TABLET | ORAL | Status: AC | PRN

## 2015-10-21 NOTE — Telephone Encounter (Signed)
Santiago Glad from [facility] returned call and left message stating that patients insurance covered the hospital bed complete.y, no cost to patient. Called and spoke with patient, who stated he would definitely like the bed. Order confirmed with Ned Card, NP and ordered in epic. Called and left message with Santiago Glad to proceed with order. Informed patient to expect a call from [facility] to set up a time to come out. Patient verbalized understanding and appreciative of care.

## 2015-10-21 NOTE — Progress Notes (Signed)
Called Taylor Sanchez with [name] On site rep.. Inquiring about a possible hospital bed for patient, pt concerned about cost. Santiago Glad states she will have someone look into the cost and information. Call back number given.

## 2015-10-21 NOTE — Telephone Encounter (Signed)
Gave and printed appt sched and avs for pt for June °

## 2015-10-21 NOTE — Progress Notes (Addendum)
  [place] OFFICE PROGRESS NOTE   Diagnosis:  Non-small cell lung cancer  INTERVAL HISTORY:   Taylor Sanchez is seen today for follow-up since discharge from the hospital. He was recently diagnosed with metastatic non-small cell lung cancer. He presented with severe low back pain. He began palliative radiation to the lumbar spine 10/12/2015.  He reports good pain control when he is laying flat, slightly reclined. The pain increases with movement/ambulation. He is currently taking MS Contin 30 mg every 12 hours. He rarely takes oxycodone. Bowels are moving. No nausea or vomiting. No shortness of breath. No bleeding.  Objective:  Vital signs in last 24 hours:  Blood pressure 108/78, pulse 80, temperature 98.4 F (36.9 C), temperature source Oral, resp. rate 18, height 6' 1" (1.854 m), weight 178 lb 3.2 oz (80.831 kg), SpO2 97 %.    HEENT: No thrush or ulcers. Resp: Lungs clear bilaterally. Cardio: Regular rate and rhythm. GI: Abdomen soft and nontender. No hepatomegaly. Vascular: No leg edema. Neuro: Leg strength intact.  Skin: No rash.    Lab Results:  Lab Results  Component Value Date   WBC 11.8* 10/12/2015   HGB 13.4 10/12/2015   HCT 38.4* 10/12/2015   MCV 85.0 10/12/2015   PLT 223 10/12/2015   NEUTROABS 11.8* 10/09/2015    Imaging:  No results found.  Medications: I have reviewed the patient's current medications.  Assessment/Plan: 1. Stage IV non-small cell lung cancer  MRI lumbar spine 10/06/2015, CT scans 10/10/2015 reveal multiple destructive bone lesions, chest lymphadenopathy, lung nodules/masses  CT-guided biopsy of L4 lesion 10/11/2015 confirmed poorly differentiated adenocarcinoma  Palliative radiation to the lumbar spine initiated 10/12/2015 3. Probable postobstructive pneumonia in the left lower lobe on CT 10/10/2015 4. Small left lower right lower lobe pulmonary embolism on CT 10/10/2015, right leg DVT on a Doppler  10/12/2015-heparin initiated 10/10/2015, switched to Xarelto 10/13/2015   Disposition:Taylor Sanchez was recently diagnosed with metastatic non-small cell lung cancer. He is currently completing palliative radiation to the lumbar spine. He is scheduled to complete radiation on 10/25/2015. We have requested ALK and EGFR testing, results pending. We will also request PDL-1.   His pain is controlled with MS Contin 30 mg every 12 hours. He has oxycodone to take as needed. He is currently on Decadron 4 mg twice daily. He will decrease the Decadron to 4 mg daily beginning 10/26/2015.  He will return for a follow-up visit on 10/26/2015 to review the outstanding results and discuss treatment options.  We will contact advanced home care to try and arrange for an adjustable bed.  Patient seen with Dr. Benay Spice. 25 minutes were spent face-to-face at today's visit with the majority of that time involved in counseling/coordination of care.   Ned Card ANP/GNP-BC   10/21/2015  2:09 PM   This was a shared visit with Ned Card. Taylor Sanchez is completing palliative radiation. The pain has improved. We are waiting on Foundation 1 and PDL 1 testing from the lumbar biopsy. He will return for an office visit to discuss systemic treatment options the week.  Julieanne Manson, M.D.

## 2015-10-24 ENCOUNTER — Ambulatory Visit
Admit: 2015-10-24 | Discharge: 2015-10-24 | Disposition: A | Discharge status: Home / Self Care | Payer: BC Managed Care – PPO | Attending: Radiation Oncology | Admitting: Radiation Oncology

## 2015-10-24 DIAGNOSIS — Z510 Encounter for antineoplastic radiation therapy: Secondary | ICD-10-CM | POA: Diagnosis not present

## 2015-10-25 ENCOUNTER — Encounter: Payer: Self-pay | Admitting: Radiation Oncology

## 2015-10-25 ENCOUNTER — Ambulatory Visit
Admit: 2015-10-25 | Discharge: 2015-10-25 | Disposition: A | Discharge status: Home / Self Care | Payer: BC Managed Care – PPO | Attending: Radiation Oncology | Admitting: Radiation Oncology

## 2015-10-25 ENCOUNTER — Ambulatory Visit: Payer: BC Managed Care – PPO

## 2015-10-25 ENCOUNTER — Ambulatory Visit
Admission: RE | Admit: 2015-10-25 | Discharge: 2015-10-25 | Disposition: A | Discharge status: Home / Self Care | Payer: BC Managed Care – PPO | Source: Ambulatory Visit | Attending: Radiation Oncology | Admitting: Radiation Oncology

## 2015-10-25 VITALS — BP 145/91 | HR 58 | Temp 97.5°F | Resp 18 | Ht 73.0 in | Wt 175.0 lb

## 2015-10-25 DIAGNOSIS — C7951 Secondary malignant neoplasm of bone: Secondary | ICD-10-CM

## 2015-10-25 DIAGNOSIS — C3490 Malignant neoplasm of unspecified part of unspecified bronchus or lung: Secondary | ICD-10-CM | POA: Diagnosis not present

## 2015-10-25 DIAGNOSIS — Z510 Encounter for antineoplastic radiation therapy: Secondary | ICD-10-CM | POA: Diagnosis not present

## 2015-10-25 NOTE — Progress Notes (Signed)
  Radiation Oncology         [phone]) [phone] ________________________________  Name: Taylor Sanchez MRN: [id number]  Date: 10/25/2015  DOB: 01-07-1976  End of Treatment Note  Diagnosis:   Stage IV lung cancer  Indication for treatment:  palliative       Radiation treatment dates:   10/12/15-10/25/15  Site/dose:   L3-sacrum to 30 Gy in 10 fractions at 3 Gy per fraction.   Beams/energy:   AP/PA with 15 MV photons.   Narrative: The patient tolerated radiation treatment relatively well.   He has had a great pain response and is walking more with a walker.  He was weaned off decadron at the end of his treatment and was ready to start systemic therapy.   Plan: The patient has completed radiation treatment. The patient will return to radiation oncology clinic for routine followup in one month. I advised them to call or return sooner if they have any questions or concerns related to their recovery or treatment.  ------------------------------------------------  Thea Silversmith, MD

## 2015-10-25 NOTE — Progress Notes (Signed)
Taylor Sanchez has received 10 fractions to his L-spine.  Skin to L-spine is with normal color.  Using Sonafine bid.  Appetite is good.  Having fatigue at night.  Pain 2/10 taking MS Contin,Oxycodone and Tramadol.  Decadron 4 mg. Bid.  No thrush.  Drinking a lot of water.   No change in bowel and bladder pattern.   EOT done 10-21-15. BP 145/91 mmHg  Pulse 58  Temp(Src) 97.5 F (36.4 C) (Oral)  Resp 18  Ht '6\' 1"'$  (1.854 m)  Wt 175 lb (79.379 kg)  BMI 23.09 kg/m2  SpO2 97%

## 2015-10-26 ENCOUNTER — Encounter: Payer: Self-pay | Admitting: *Deleted

## 2015-10-26 ENCOUNTER — Encounter: Payer: Self-pay | Admitting: Pharmacist

## 2015-10-26 ENCOUNTER — Other Ambulatory Visit: Payer: BC Managed Care – PPO

## 2015-10-26 ENCOUNTER — Encounter: Payer: Self-pay | Admitting: Oncology

## 2015-10-26 ENCOUNTER — Ambulatory Visit: Payer: BC Managed Care – PPO

## 2015-10-26 ENCOUNTER — Ambulatory Visit (HOSPITAL_BASED_OUTPATIENT_CLINIC_OR_DEPARTMENT_OTHER): Payer: BC Managed Care – PPO | Admitting: Oncology

## 2015-10-26 VITALS — BP 138/83 | HR 69 | Temp 97.8°F | Resp 20 | Ht 73.0 in | Wt 175.7 lb

## 2015-10-26 DIAGNOSIS — D492 Neoplasm of unspecified behavior of bone, soft tissue, and skin: Secondary | ICD-10-CM

## 2015-10-26 DIAGNOSIS — C3490 Malignant neoplasm of unspecified part of unspecified bronchus or lung: Secondary | ICD-10-CM | POA: Diagnosis not present

## 2015-10-26 DIAGNOSIS — C7951 Secondary malignant neoplasm of bone: Secondary | ICD-10-CM

## 2015-10-26 MED ORDER — DEXAMETHASONE 4 MG PO TABS: ORAL_TABLET | ORAL | Status: DC

## 2015-10-26 NOTE — Progress Notes (Signed)
  [place] OFFICE PROGRESS NOTE   Diagnosis: Non-small cell lung cancer  INTERVAL HISTORY:   Taylor Sanchez returns as scheduled. He completed radiation on 10/25/2015. He has noted improvement in the lower back pain. He now has pain only when he stands. He is able to ambulate. No difficulty with bowel or bladder function. 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  Objective:  Vital signs in last 24 hours:  Blood pressure 138/83, pulse 69, temperature 97.8 F (36.6 C), temperature source Oral, resp. rate 20, height 6' 1" (1.854 m), weight 175 lb 11.2 oz (79.697 kg), SpO2 98 %.    HEENT: No thrush Resp: Lungs clear bilaterally Cardio: Regular rate and rhythm GI: No hepatomegaly, nontender Vascular: No leg edema   Lab Results:  Lab Results  Component Value Date   WBC 11.8* 10/12/2015   HGB 13.4 10/12/2015   HCT 38.4* 10/12/2015   MCV 85.0 10/12/2015   PLT 223 10/12/2015   NEUTROABS 11.8* 10/09/2015     Medications: I have reviewed the patient's current medications.  Assessment/Plan: 1. 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 Probable postobstructive pneumonia in the left lower lobe on CT 10/10/2015 4. Small left lower right lower lobe pulmonary embolism on CT 10/10/2015, right leg DVT on a Doppler 10/12/2015-heparin initiated 10/10/2015, switched to Xarelto 10/13/2015   Disposition:TaylorSanchez has completed palliative radiation. The pain has improved. He will taper Decadron to off over the next week. He will increase ambulation as  tolerated.  The Foundation 1 testing has confirmed the uncommon ROS1 rearrangement. I discussed the implication of this with Taylor Sanchez and his wife. There is a significant chance he can have a clinical response with crizotinib . He may be a candidate for other targeted therapies, systemic chemotherapy, and immunotherapy in the future.  We reviewed the potential toxicities associated with Crizotinib including diarrhea, nausea, hepatic toxicity, visual disturbance, edema, bradycardia, and hematologic toxicity. He met with the Cancer center oral chemotherapy pharmacist today and was given reading materials on Crizotinib. He will attend a chemotherapy therapy teaching class. He agrees to proceed with chemotherapy. Taylor Sanchez will return for an office visit in one week.       Betsy Coder, MD  10/26/2015  10:05 AM

## 2015-10-26 NOTE — Progress Notes (Signed)
Sent mess to dr. Libby Maw sherrill to get me or dr. Benay Spice the fmla forms he left with her. I spoke with the patient and he said she would get those to dr. Benay Spice.

## 2015-10-26 NOTE — Progress Notes (Signed)
Oral Chemotherapy Pharmacist Encounter  I spoke with patient Madyx Delfin for overview of new oral chemotherapy medication: Xalkori (crizotinib). Pt is doing well. Awaiting prescription from MD, will send to [facility] outpatient pharmacy. Potential start date around November 02, 2015 pending prescription processing time.  Counseled patient on administration, dosing, side effects, safe handling, and monitoring. Side effects include but not limited to: pneumonitis, liver problems, vision changes, N/V/D, constipation, swelling of hands and feet. Advised patient avoid grapefruit while on this treatment, and can take with or without food. Mr. Munyan voiced understanding and appreciation. Patient provided with card and contact number for Oral Oncology Clinic.  All questions answered.  Will follow up with patient regarding insurance and pharmacy. Will follow up in 1-2 weeks for adherence and toxicity management.   Thank you,  Skip Mayer, PharmD, BCPS Oral Chemotherapy Clinic

## 2015-10-27 ENCOUNTER — Other Ambulatory Visit: Payer: Self-pay | Admitting: *Deleted

## 2015-10-27 ENCOUNTER — Ambulatory Visit: Payer: BC Managed Care – PPO

## 2015-10-27 ENCOUNTER — Telehealth: Payer: Self-pay | Admitting: Oncology

## 2015-10-27 DIAGNOSIS — C3490 Malignant neoplasm of unspecified part of unspecified bronchus or lung: Secondary | ICD-10-CM

## 2015-10-27 MED ORDER — CRIZOTINIB 250 MG PO CAPS: 250.0000 mg | ORAL_CAPSULE | Freq: Two times a day (BID) | ORAL | Status: DC

## 2015-10-27 NOTE — Telephone Encounter (Signed)
left msg confirming 6/28 apt

## 2015-10-28 ENCOUNTER — Telehealth: Payer: Self-pay | Admitting: *Deleted

## 2015-10-28 ENCOUNTER — Telehealth: Payer: Self-pay

## 2015-10-28 ENCOUNTER — Ambulatory Visit: Payer: BC Managed Care – PPO

## 2015-10-28 ENCOUNTER — Encounter: Payer: Self-pay | Admitting: Pharmacist

## 2015-10-28 ENCOUNTER — Other Ambulatory Visit: Payer: Self-pay | Admitting: Oncology

## 2015-10-28 MED FILL — *XALKORI 250MG CAPSULE: 250 | 30 days supply | Qty: 60 | Fill #0

## 2015-10-28 NOTE — Telephone Encounter (Signed)
Spoke with pt, informed him Flexeril Rx has been changed to PRN. He does feel the Neurontin is helping with his pain, Rx refilled per Dr. Benay Spice.

## 2015-10-28 NOTE — Telephone Encounter (Signed)
RX Crisotinib (Xalkori) received PA approval from insurance from 10/28/15 to 10/27/2016.  [facility] sent request to insurance, received notice of $2400 co pay to pick up medication.  I enrolled patient into Xalkori co pay assistance and received card for patient.  ID: [id number] BIN: [id number] Group: [id number] Exp: 05/13/16 Max limit $25,000 per year  Sent info to [employer] and received notification that co pay went to $10 and they have contacted the patient to pick up the medication.  Will follow up next week for counseling.  Thank you  Henreitta Leber, PharmD Oral [facility]

## 2015-10-28 NOTE — Progress Notes (Signed)
10/28/15: New Rx Xalkori (crizotinib) faxed to [facility]

## 2015-10-28 NOTE — Telephone Encounter (Signed)
Message from pt requesting FMLA forms to be amended. He needs leave extended through August. He reports forms were sent to extend to 6/21 (from 6/20). Message forwarded to managed care dept to locate forms.

## 2015-10-28 NOTE — Progress Notes (Signed)
Xalkori required prior authorization and copy of Foundation One testing, sent to [facility] 10/28/15

## 2015-10-31 ENCOUNTER — Encounter (HOSPITAL_COMMUNITY): Payer: Self-pay

## 2015-10-31 ENCOUNTER — Encounter: Payer: Self-pay | Admitting: Radiation Oncology

## 2015-10-31 ENCOUNTER — Ambulatory Visit: Payer: BC Managed Care – PPO

## 2015-10-31 ENCOUNTER — Telehealth: Payer: Self-pay | Admitting: *Deleted

## 2015-10-31 DIAGNOSIS — D492 Neoplasm of unspecified behavior of bone, soft tissue, and skin: Secondary | ICD-10-CM

## 2015-10-31 DIAGNOSIS — C3490 Malignant neoplasm of unspecified part of unspecified bronchus or lung: Secondary | ICD-10-CM

## 2015-10-31 MED ORDER — DEXAMETHASONE 4 MG PO TABS: ORAL_TABLET | ORAL | Status: DC

## 2015-10-31 MED FILL — DEXAMETHASONE 4 MG TABLET: 4 | 36 days supply | Qty: 30 | Fill #0

## 2015-10-31 NOTE — Telephone Encounter (Signed)
Message from pt reporting "brutal pain" over the weekend. He has tapered down the Decadron. Reports "muscle pain, nerve pain, joint pain and bone pain." Requesting to add anti-inflammatory med to regimen in place of Decadron. Pt reports he has taken #6 Oxycodone over the weekend. Reviewed with Dr. Benay Spice: Resume Decadron 4 mg daily until next visit. Returned call to pt, he voiced understanding. Rx e-prescribed to [facility] as his Hulda Humphrey is ready for pick up there.

## 2015-10-31 NOTE — Progress Notes (Signed)
Paperwork faxed to [phone], conf received 6/16 Ardeen Fillers)

## 2015-11-01 ENCOUNTER — Encounter: Payer: Self-pay | Admitting: Radiation Oncology

## 2015-11-01 ENCOUNTER — Ambulatory Visit: Payer: BC Managed Care – PPO

## 2015-11-01 NOTE — Progress Notes (Signed)
Paperwork re faxed with signature and extended leave date, faxed and confirmation received 6/20, copy mailed to patient Taylor Sanchez)

## 2015-11-03 ENCOUNTER — Telehealth: Payer: Self-pay | Admitting: *Deleted

## 2015-11-03 NOTE — Telephone Encounter (Signed)
Oncology Nurse Navigator Documentation  Oncology Nurse Navigator Flowsheets 11/03/2015  Navigator Encounter Type Telephone  Treatment Initiated Date 10/31/2015  Treatment Phase Treatment  Barriers/Navigation Needs Education  Education Other  Interventions Education Method  Education Method Verbal  Acuity Level 1  Time Spent with Patient 15   I called to speak to Mr. Lyford today.  He states he is doing well.  He stated he started Orange on 10/31/15.  I asked him about any side effects.  He stated he had some blurry vision.  I listened as he explained.  I explained Xalkori and side effects.  I will update Dr. Benay Spice regarding medication start date.

## 2015-11-08 ENCOUNTER — Telehealth: Payer: Self-pay | Admitting: Nurse Practitioner

## 2015-11-08 ENCOUNTER — Telehealth: Payer: Self-pay

## 2015-11-08 NOTE — Telephone Encounter (Signed)
-----   Message from Owens Shark, NP sent at 11/08/2015 12:36 PM EDT ----- Please see if he can move his appointment tomorrow to either 145 or 1015. Thanks

## 2015-11-08 NOTE — Telephone Encounter (Signed)
Called and spoke with patient, he is in agreement to come in at 40 for [place], 130 for lab. POF sent to have appt moved.

## 2015-11-08 NOTE — Telephone Encounter (Signed)
Change apts per pof, pt aware per pof

## 2015-11-09 ENCOUNTER — Ambulatory Visit (HOSPITAL_BASED_OUTPATIENT_CLINIC_OR_DEPARTMENT_OTHER): Payer: BC Managed Care – PPO | Admitting: Nurse Practitioner

## 2015-11-09 ENCOUNTER — Other Ambulatory Visit: Payer: BC Managed Care – PPO

## 2015-11-09 ENCOUNTER — Telehealth: Payer: Self-pay | Admitting: Oncology

## 2015-11-09 ENCOUNTER — Other Ambulatory Visit (HOSPITAL_BASED_OUTPATIENT_CLINIC_OR_DEPARTMENT_OTHER): Payer: BC Managed Care – PPO

## 2015-11-09 ENCOUNTER — Ambulatory Visit: Payer: BC Managed Care – PPO | Admitting: Nurse Practitioner

## 2015-11-09 VITALS — BP 120/79 | HR 60 | Temp 97.9°F | Resp 18 | Ht 73.0 in | Wt 179.7 lb

## 2015-11-09 DIAGNOSIS — I2699 Other pulmonary embolism without acute cor pulmonale: Secondary | ICD-10-CM | POA: Diagnosis not present

## 2015-11-09 DIAGNOSIS — C3490 Malignant neoplasm of unspecified part of unspecified bronchus or lung: Secondary | ICD-10-CM

## 2015-11-09 DIAGNOSIS — D696 Thrombocytopenia, unspecified: Secondary | ICD-10-CM | POA: Diagnosis not present

## 2015-11-09 DIAGNOSIS — C7951 Secondary malignant neoplasm of bone: Secondary | ICD-10-CM | POA: Diagnosis not present

## 2015-11-09 DIAGNOSIS — D492 Neoplasm of unspecified behavior of bone, soft tissue, and skin: Secondary | ICD-10-CM

## 2015-11-09 LAB — COMPREHENSIVE METABOLIC PANEL
ALBUMIN: 2.9 g/dL — AB (ref 3.5–5.0)
ALK PHOS: 729 U/L — AB (ref 40–150)
ALT: 36 U/L (ref 0–55)
AST: 22 U/L (ref 5–34)
Anion Gap: 6 mEq/L (ref 3–11)
BILIRUBIN TOTAL: 0.34 mg/dL (ref 0.20–1.20)
BUN: 21.2 mg/dL (ref 7.0–26.0)
CO2: 28 meq/L (ref 22–29)
Calcium: 8.2 mg/dL — ABNORMAL LOW (ref 8.4–10.4)
Chloride: 102 mEq/L (ref 98–109)
Creatinine: 0.7 mg/dL (ref 0.7–1.3)
EGFR: >90 mL/min/{1.73_m2} (ref >90)
GLUCOSE: 101 mg/dL (ref 70–140)
Potassium: 4.7 mEq/L (ref 3.5–5.1)
SODIUM: 136 meq/L (ref 136–145)
TOTAL PROTEIN: 5.5 g/dL — AB (ref 6.4–8.3)

## 2015-11-09 LAB — CBC WITH DIFFERENTIAL/PLATELET
BASO%: 0.1 % (ref 0.0–2.0)
Basophils Absolute: 0 10*3/uL (ref 0.0–0.1)
EOS ABS: 0 10*3/uL (ref 0.0–0.5)
EOS%: 0.2 % (ref 0.0–7.0)
HCT: 40.1 % (ref 38.4–49.9)
HEMOGLOBIN: 13.9 g/dL (ref 13.0–17.1)
LYMPH%: 9.3 % — AB (ref 14.0–49.0)
MCH: 30.2 pg (ref 27.2–33.4)
MCHC: 34.7 g/dL (ref 32.0–36.0)
MCV: 87.2 fL (ref 79.3–98.0)
MONO#: 0.8 10*3/uL (ref 0.1–0.9)
MONO%: 8.8 % (ref 0.0–14.0)
NEUT%: 81.6 % — ABNORMAL HIGH (ref 39.0–75.0)
NEUTROS ABS: 7.6 10*3/uL — AB (ref 1.5–6.5)
Platelets: 108 10*3/uL — ABNORMAL LOW (ref 140–400)
RBC: 4.6 10*6/uL (ref 4.20–5.82)
RDW: 15 % — AB (ref 11.0–14.6)
WBC: 9.3 10*3/uL (ref 4.0–10.3)
lymph#: 0.9 10*3/uL (ref 0.9–3.3)

## 2015-11-09 LAB — TECHNOLOGIST REVIEW

## 2015-11-09 MED ORDER — RIVAROXABAN 20 MG PO TABS: 20.0000 mg | ORAL_TABLET | Freq: Every day | ORAL | Status: DC

## 2015-11-09 NOTE — Telephone Encounter (Signed)
Gave pt cal & avs °

## 2015-11-09 NOTE — Progress Notes (Addendum)
  [place] OFFICE PROGRESS NOTE   Diagnosis:  Non-small cell lung cancer  INTERVAL HISTORY:   Taylor Sanchez returns as scheduled. He began crizotinib 10/31/2015. He denies nausea/vomiting. No mouth sores. No diarrhea. No rash. No visual disturbance. No blurred vision. He periodically wakes up with the "room spinning". Pain continues to be improved. He has intermittent numbness in the right leg. He reports increased thirst.  Objective:  Vital signs in last 24 hours:  Blood pressure 120/79, pulse 60, temperature 97.9 F (36.6 C), temperature source Oral, resp. rate 18, height '6\' 1"'$  (1.854 m), weight 179 lb 11.2 oz (81.511 kg), SpO2 98 %.    HEENT: No thrush or ulcers. Resp: Lungs clear bilaterally. Cardio: Regular rate and rhythm. GI: Abdomen soft and nontender. No hepatomegaly. Vascular: No leg edema. Skin: No rash.    Lab Results:  Lab Results  Component Value Date   WBC 9.3 11/09/2015   HGB 13.9 11/09/2015   HCT 40.1 11/09/2015   MCV 87.2 11/09/2015   PLT 108* 11/09/2015   NEUTROABS 7.6* 11/09/2015    Imaging:  No results found.  Medications: I have reviewed the patient's current medications.  Assessment/Plan: 1. Stage IV non-small cell lung cancer  MRI lumbar spine 10/06/2015, CT scans 10/10/2015 reveal multiple destructive bone lesions, chest lymphadenopathy, lung nodules/masses  CT-guided biopsy of L4 lesion 10/11/2015 confirmed poorly differentiated adenocarcinoma  Foundation 1 testing confirmed a ROS1 rearrangement Intron 23 , negative for EGFR and ALK alterations  Palliative radiation to the lumbar spine initiated 10/12/2015, completed 10/25/2015  Initiation of crizotinib 10/31/2015 3. Probable postobstructive pneumonia in the left lower lobe on CT 10/10/2015 4. Small left lower right lower lobe pulmonary embolism on CT 10/10/2015, right leg DVT on a Doppler 10/12/2015-heparin initiated 10/10/2015, switched to Xarelto  10/13/2015   Disposition: Taylor Sanchez appears stable. He will continue Crizotinib.   He has mild thrombocytopenia on labs today. We reviewed precautions. He will return in one week for a CBC. We will also repeat a chemistry panel in one week to follow-up the alkaline phosphatase which was elevated today.  Dr. Benay Spice recommends a staging MRI of the brain. This will be done in the next one to 2 weeks.  He will return for a follow-up visit in 2 weeks. He will contact the office in the interim with any problems.  Patient seen with Dr. Benay Spice. 25 minutes were spent face-to-face at today's visit with the majority of that time involved in counseling/coordination of care.    Ned Card ANP/GNP-BC   11/09/2015  3:52 PM   This was a shared visit with Ned Card. Taylor Sanchez is tolerating the crizotinib well. He will return for a CBC in one week.  Julieanne Manson, M.D.

## 2015-11-10 ENCOUNTER — Other Ambulatory Visit: Payer: BC Managed Care – PPO

## 2015-11-10 ENCOUNTER — Ambulatory Visit: Payer: BC Managed Care – PPO | Admitting: Nurse Practitioner

## 2015-11-10 LAB — PHOSPHORUS: PHOSPHORUS: 3.3 mg/dL (ref 2.5–4.5)

## 2015-11-16 ENCOUNTER — Telehealth: Payer: Self-pay | Admitting: *Deleted

## 2015-11-16 NOTE — Telephone Encounter (Signed)
Call from [place] in [place]: pt reported he has not been called with MRI appt. Pt is also asking if he needs lab appt on 7/6? Called radiology schedulers, requested they contact pt to schedule MRI. Informed pt he should keep lab appt to monitor chemo toxicity. He voiced understanding. Pt started Xalkori on 6/19.

## 2015-11-17 ENCOUNTER — Other Ambulatory Visit (HOSPITAL_BASED_OUTPATIENT_CLINIC_OR_DEPARTMENT_OTHER): Payer: BC Managed Care – PPO

## 2015-11-17 ENCOUNTER — Encounter: Payer: Self-pay | Admitting: Oncology

## 2015-11-17 ENCOUNTER — Ambulatory Visit: Payer: BC Managed Care – PPO | Admitting: Radiation Oncology

## 2015-11-17 DIAGNOSIS — C3490 Malignant neoplasm of unspecified part of unspecified bronchus or lung: Secondary | ICD-10-CM | POA: Diagnosis not present

## 2015-11-17 LAB — CBC WITH DIFFERENTIAL/PLATELET
BASO%: 0.2 % (ref 0.0–2.0)
Basophils Absolute: 0 10*3/uL (ref 0.0–0.1)
EOS ABS: 0 10*3/uL (ref 0.0–0.5)
EOS%: 0.7 % (ref 0.0–7.0)
HEMATOCRIT: 40.9 % (ref 38.4–49.9)
HGB: 14.3 g/dL (ref 13.0–17.1)
LYMPH#: 0.8 10*3/uL — AB (ref 0.9–3.3)
LYMPH%: 13.3 % — ABNORMAL LOW (ref 14.0–49.0)
MCH: 30.4 pg (ref 27.2–33.4)
MCHC: 35 g/dL (ref 32.0–36.0)
MCV: 86.8 fL (ref 79.3–98.0)
MONO#: 0.4 10*3/uL (ref 0.1–0.9)
MONO%: 6.7 % (ref 0.0–14.0)
NEUT%: 79.1 % — AB (ref 39.0–75.0)
NEUTROS ABS: 4.5 10*3/uL (ref 1.5–6.5)
PLATELETS: 126 10*3/uL — AB (ref 140–400)
RBC: 4.71 10*6/uL (ref 4.20–5.82)
RDW: 15.8 % — AB (ref 11.0–14.6)
WBC: 5.7 10*3/uL (ref 4.0–10.3)

## 2015-11-17 LAB — COMPREHENSIVE METABOLIC PANEL
ALT: 30 U/L (ref 0–55)
ANION GAP: 9 meq/L (ref 3–11)
AST: 22 U/L (ref 5–34)
Albumin: 2.7 g/dL — ABNORMAL LOW (ref 3.5–5.0)
Alkaline Phosphatase: 939 U/L — ABNORMAL HIGH (ref 40–150)
BILIRUBIN TOTAL: 0.53 mg/dL (ref 0.20–1.20)
BUN: 14.3 mg/dL (ref 7.0–26.0)
CALCIUM: 8.8 mg/dL (ref 8.4–10.4)
CO2: 29 meq/L (ref 22–29)
CREATININE: 0.9 mg/dL (ref 0.7–1.3)
Chloride: 102 mEq/L (ref 98–109)
EGFR: >90 mL/min/{1.73_m2} (ref >90)
Glucose: 127 mg/dl (ref 70–140)
Potassium: 4.3 mEq/L (ref 3.5–5.1)
Sodium: 139 mEq/L (ref 136–145)
TOTAL PROTEIN: 5.9 g/dL — AB (ref 6.4–8.3)

## 2015-11-18 ENCOUNTER — Telehealth: Payer: Self-pay | Admitting: *Deleted

## 2015-11-18 ENCOUNTER — Ambulatory Visit (HOSPITAL_COMMUNITY)
Admission: RE | Admit: 2015-11-18 | Discharge: 2015-11-18 | Disposition: A | Discharge status: Home / Self Care | Payer: BC Managed Care – PPO | Source: Ambulatory Visit | Attending: Nurse Practitioner | Admitting: Nurse Practitioner

## 2015-11-18 ENCOUNTER — Telehealth: Payer: Self-pay

## 2015-11-18 DIAGNOSIS — D492 Neoplasm of unspecified behavior of bone, soft tissue, and skin: Secondary | ICD-10-CM

## 2015-11-18 DIAGNOSIS — C3490 Malignant neoplasm of unspecified part of unspecified bronchus or lung: Secondary | ICD-10-CM | POA: Diagnosis present

## 2015-11-18 DIAGNOSIS — C7951 Secondary malignant neoplasm of bone: Secondary | ICD-10-CM

## 2015-11-18 DIAGNOSIS — M549 Dorsalgia, unspecified: Secondary | ICD-10-CM

## 2015-11-18 DIAGNOSIS — C7931 Secondary malignant neoplasm of brain: Secondary | ICD-10-CM | POA: Diagnosis not present

## 2015-11-18 MED ORDER — GADOBENATE DIMEGLUMINE 529 MG/ML IV SOLN
20.0000 mL | Freq: Once | INTRAVENOUS | Status: AC | PRN
  Administered 2015-11-18: 16 mL via INTRAVENOUS

## 2015-11-18 NOTE — Telephone Encounter (Signed)
Call from pt requesting PT referral to be sent to [facility]. He has had an evaluation and was not able to participate at the time. Reviewed with Dr. Benay Spice: OK to make referral. Orders entered, notified [facility].

## 2015-11-18 NOTE — Telephone Encounter (Signed)
-----   Message from Owens Shark, NP sent at 11/18/2015  9:21 AM EDT ----- Please let him know the platelet count is better. One of his liver functions, alkaline phosphatase, remains elevated. We will repeat at the time of his next visit. Follow-up as scheduled.

## 2015-11-18 NOTE — Telephone Encounter (Signed)
Pt aware.  Will keep follow up appointment with Dr Benay Spice 11/23/15.

## 2015-11-23 ENCOUNTER — Other Ambulatory Visit (HOSPITAL_BASED_OUTPATIENT_CLINIC_OR_DEPARTMENT_OTHER): Payer: BC Managed Care – PPO

## 2015-11-23 ENCOUNTER — Encounter: Payer: Self-pay | Admitting: Radiation Oncology

## 2015-11-23 ENCOUNTER — Ambulatory Visit
Admission: RE | Admit: 2015-11-23 | Discharge: 2015-11-23 | Disposition: A | Discharge status: Home / Self Care | Payer: BC Managed Care – PPO | Source: Ambulatory Visit | Attending: Radiation Oncology | Admitting: Radiation Oncology

## 2015-11-23 ENCOUNTER — Telehealth: Payer: Self-pay | Admitting: Oncology

## 2015-11-23 ENCOUNTER — Ambulatory Visit (HOSPITAL_BASED_OUTPATIENT_CLINIC_OR_DEPARTMENT_OTHER): Payer: BC Managed Care – PPO | Admitting: Oncology

## 2015-11-23 ENCOUNTER — Other Ambulatory Visit: Payer: Self-pay | Admitting: *Deleted

## 2015-11-23 ENCOUNTER — Other Ambulatory Visit: Payer: Self-pay | Admitting: Oncology

## 2015-11-23 VITALS — BP 104/66 | HR 86 | Temp 98.4°F | Resp 18 | Ht 73.0 in | Wt 179.9 lb

## 2015-11-23 DIAGNOSIS — R600 Localized edema: Secondary | ICD-10-CM | POA: Insufficient documentation

## 2015-11-23 DIAGNOSIS — Z880 Allergy status to penicillin: Secondary | ICD-10-CM | POA: Insufficient documentation

## 2015-11-23 DIAGNOSIS — C3492 Malignant neoplasm of unspecified part of left bronchus or lung: Secondary | ICD-10-CM

## 2015-11-23 DIAGNOSIS — C7931 Secondary malignant neoplasm of brain: Secondary | ICD-10-CM | POA: Insufficient documentation

## 2015-11-23 DIAGNOSIS — M545 Low back pain: Secondary | ICD-10-CM

## 2015-11-23 DIAGNOSIS — C3490 Malignant neoplasm of unspecified part of unspecified bronchus or lung: Secondary | ICD-10-CM

## 2015-11-23 DIAGNOSIS — C7951 Secondary malignant neoplasm of bone: Secondary | ICD-10-CM

## 2015-11-23 DIAGNOSIS — R110 Nausea: Secondary | ICD-10-CM | POA: Diagnosis not present

## 2015-11-23 DIAGNOSIS — C801 Malignant (primary) neoplasm, unspecified: Secondary | ICD-10-CM

## 2015-11-23 DIAGNOSIS — G609 Hereditary and idiopathic neuropathy, unspecified: Secondary | ICD-10-CM

## 2015-11-23 DIAGNOSIS — D492 Neoplasm of unspecified behavior of bone, soft tissue, and skin: Secondary | ICD-10-CM

## 2015-11-23 DIAGNOSIS — R3911 Hesitancy of micturition: Secondary | ICD-10-CM

## 2015-11-23 DIAGNOSIS — C7949 Secondary malignant neoplasm of other parts of nervous system: Secondary | ICD-10-CM

## 2015-11-23 LAB — COMPREHENSIVE METABOLIC PANEL
ALBUMIN: 2.4 g/dL — AB (ref 3.5–5.0)
ALK PHOS: 447 U/L — AB (ref 40–150)
ALT: 26 U/L (ref 0–55)
ANION GAP: 6 meq/L (ref 3–11)
AST: 27 U/L (ref 5–34)
BUN: 11.1 mg/dL (ref 7.0–26.0)
CALCIUM: 8.4 mg/dL (ref 8.4–10.4)
CO2: 32 mEq/L — ABNORMAL HIGH (ref 22–29)
CREATININE: 0.8 mg/dL (ref 0.7–1.3)
Chloride: 104 mEq/L (ref 98–109)
EGFR: >90 mL/min/{1.73_m2} (ref >90)
Glucose: 98 mg/dl (ref 70–140)
POTASSIUM: 4.2 meq/L (ref 3.5–5.1)
Sodium: 141 mEq/L (ref 136–145)
Total Bilirubin: 0.47 mg/dL (ref 0.20–1.20)
Total Protein: 5.2 g/dL — ABNORMAL LOW (ref 6.4–8.3)

## 2015-11-23 LAB — CBC WITH DIFFERENTIAL/PLATELET
BASO%: 0.2 % (ref 0.0–2.0)
BASOS ABS: 0 10*3/uL (ref 0.0–0.1)
EOS ABS: 0.1 10*3/uL (ref 0.0–0.5)
EOS%: 1 % (ref 0.0–7.0)
HEMATOCRIT: 32.8 % — AB (ref 38.4–49.9)
HEMOGLOBIN: 11.4 g/dL — AB (ref 13.0–17.1)
LYMPH#: 0.9 10*3/uL (ref 0.9–3.3)
LYMPH%: 18.2 % (ref 14.0–49.0)
MCH: 29.8 pg (ref 27.2–33.4)
MCHC: 34.8 g/dL (ref 32.0–36.0)
MCV: 85.6 fL (ref 79.3–98.0)
MONO#: 0.3 10*3/uL (ref 0.1–0.9)
MONO%: 6.6 % (ref 0.0–14.0)
NEUT#: 3.8 10*3/uL (ref 1.5–6.5)
NEUT%: 74 % (ref 39.0–75.0)
PLATELETS: 233 10*3/uL (ref 140–400)
RBC: 3.83 10*6/uL — ABNORMAL LOW (ref 4.20–5.82)
RDW: 15.8 % — AB (ref 11.0–14.6)
WBC: 5.1 10*3/uL (ref 4.0–10.3)

## 2015-11-23 MED ORDER — CRIZOTINIB 250 MG PO CAPS: 250.0000 mg | ORAL_CAPSULE | Freq: Two times a day (BID) | ORAL | Status: DC

## 2015-11-23 MED ORDER — MORPHINE SULFATE ER 30 MG PO TBCR: 30.0000 mg | EXTENDED_RELEASE_TABLET | Freq: Two times a day (BID) | ORAL | Status: DC

## 2015-11-23 MED ORDER — GABAPENTIN 100 MG PO CAPS: ORAL_CAPSULE | ORAL | Status: DC

## 2015-11-23 MED FILL — *XALKORI 250MG CAPSULE: 250 | 30 days supply | Qty: 60 | Fill #0

## 2015-11-23 MED FILL — GABAPENTIN 100 MG CAPSULE: 100 | 15 days supply | Qty: 90 | Fill #0

## 2015-11-23 NOTE — Telephone Encounter (Signed)
Gave patient avs report and appointments for July.  °

## 2015-11-23 NOTE — Progress Notes (Addendum)
Taylor Sanchez here for follow up after treatment to his L3-Sacrum.  He reports having pain today in his right lower back/hip and leg.  He said the pain was better after radiation but returned when he stopped taking decadron and gabapentin.  He will restart gabapentin tonight.  He is also taking morphine 30 mg bid and OxyContin 5 mg prn.  He will start physical therapy tomorrow.  He reports having to force himself to urinate.  He says this also increases the pain in his right leg.  He denies having any bowel issues.  He reports having a good energy level.  He was found to have brain metastasis on MRI of the brain from 11/18/15.  He saw Dr. Benay Spice this morning and will be referred for [facility].  Vitals were taken in Dr. Gearldine Shown office.

## 2015-11-23 NOTE — Progress Notes (Signed)
Radiation Oncology         ([phone]) [phone] ________________________________  Name: Taylor Sanchez MRN: [id number]  Date: 11/23/2015  DOB: [birth year]  Follow-Up Visit Note  CC: No primary care provider on file.  No ref. provider found  No diagnosis found.  Diagnosis:   Stage IV lung cancer  Interval Since Last Radiation: 1 month.   Completed palliative radiation 10/12/2015 - 10/25/2015 to the L3-sacrum to 30 Gy in 10 fractions at 3 Gy per fraction, per Dr. Pablo Sanchez.  Narrative:  The patient returns today for routine follow-up. He reports having pain today in his right lower back/hip and leg. He said the pain was better after radiation but returned when he stopped taking decadron and gabapentin. He is unable to tell if the numbness was worse before radiation. Horrible headaches and some nausea when he stopped taking decadron and gabapentin. He will restart gabapentin tonight. He is also taking morphine 30 mg twice daily and oxycontin 5 mg as needed. He will start physical therapy tomorrow. He reports having to force himself to urinate. He says this also increases the pain in his right leg. He denies having any bowel issues. He reports having a good energy level. He was found to have brain metastasis on a PET scan from 11/18/2015. He saw Dr. Learta Sanchez this morning and will be referred for Taylor Sanchez.  ALLERGIES:  is allergic to bee venom; penicillins; and prednisone.  Meds: Current Outpatient Prescriptions  Medication Sig Dispense Refill  . crizotinib (XALKORI) 250 MG capsule Take 1 capsule (250 mg total) by mouth 2 (two) times daily. 60 capsule 0  . cyclobenzaprine (FLEXERIL) 5 MG tablet Take 1 tablet (5 mg total) by mouth 2 (two) times daily as needed for muscle spasms. 30 tablet 0  . levocetirizine (XYZAL) 5 MG tablet Take 5 mg by mouth every evening.   1  . morphine (MS CONTIN) 30 MG 12 hr tablet Take 1 tablet (30 mg total) by mouth every 12 (twelve) hours. 60 tablet 0  . Multiple  Vitamins-Minerals (MULTIVITAMIN ADULT PO) Take 1 capsule by mouth daily.    [name] oxyCODONE (OXY IR/ROXICODONE) 5 MG immediate release tablet Take 1-2 tablets (5-10 mg total) by mouth every 4 (four) hours as needed for moderate pain. 40 tablet 0  . polyethylene glycol (MIRALAX / GLYCOLAX) packet Take 17 g by mouth daily. 14 each 0  . rivaroxaban (XARELTO) 20 MG TABS tablet Take 1 tablet (20 mg total) by mouth daily with supper. 30 tablet 4  . senna-docusate (SENOKOT-S) 8.6-50 MG tablet Take 2 tablets by mouth 2 (two) times daily. 30 tablet 0  . dexamethasone (DECADRON) 4 MG tablet Take one daily for 3 days then take one every other day for 3 doses then stop. (Patient not taking: Reported on 11/23/2015) 30 tablet 0  . feeding supplement, ENSURE ENLIVE, (ENSURE ENLIVE) LIQD Take 237 mLs by mouth 2 (two) times daily between meals. (Patient not taking: Reported on 11/23/2015) 237 mL 12  . gabapentin (NEURONTIN) 100 MG capsule TAKE 2 CAPSULES (200 MG TOTAL) BY MOUTH 3 (THREE) TIMES DAILY. (Patient not taking: Reported on 11/23/2015) 90 capsule 0   No current facility-administered medications for this encounter.    Physical Findings: The patient is in no acute distress. Patient is alert and oriented.  vitals were not taken for this visit..  No significant changes.  The heart has a regular rhythm and rate. The lungs are clear to auscultation. The abdomen is soft and nontender with normal bowel  sounds. 5/5 muscle strength in his lower extremities.  Vitals with BMI 11/23/2015  Height '6\' 1"'$   Weight 179 lbs 14 oz  BMI 33.6  Systolic 122  Diastolic 66  Pulse 86  Respirations 18    Lab Findings: Lab Results  Component Value Date   WBC 5.1 11/23/2015   HGB 11.4* 11/23/2015   HCT 32.8* 11/23/2015   MCV 85.6 11/23/2015   PLT 233 11/23/2015    Radiographic Findings: Mr Taylor Sanchez Wo Contrast  11/18/2015  CLINICAL DATA:  Metastatic lung cancer staging EXAM: MRI HEAD WITHOUT AND WITH CONTRAST TECHNIQUE:  Multiplanar, multiecho pulse sequences of the brain and surrounding structures were obtained without and with intravenous contrast. CONTRAST:  44m MULTIHANCE GADOBENATE DIMEGLUMINE 529 MG/ML IV SOLN COMPARISON:  None. FINDINGS: Ventricle size normal.  Cerebral volume normal. 6 mm enhancing metastatic deposit right frontal white matter. Mild surrounding white matter. 3 mm enhancing nodule left frontal lobe compatible with metastatic disease. No other enhancing metastatic deposits in the brain Negative for acute infarct.  Brainstem and basal ganglia normal. Small areas susceptibility left anterior cerebellum consistent with calcified choroid plexus. This shows typical enhancement and location for choroid plexus. Negative for intracranial hemorrhage. No shift of the midline structures. Mild mucosal edema in the paranasal sinuses.  No orbital lesion. Pituitary and skull base normal.  Calvarium intact. IMPRESSION: Two metastatic deposits the brain, in the right frontal lobe and left frontal lobe. Electronically Signed   By: CFranchot GalloM.D.   On: 11/18/2015 13:14    Impression:  Mr. KGrillois a 40yo gentleman with Stage IV non-small cell lung cancer. Pain somewhat better with palliative radiation therapy to the spine.  A recent MRI of the brain revealed a 6 mm metastasis in the right frontal lobe and a 3 mm left frontal lobe metastasis. He is a good candidate for SRS.  Plan:  We discussed that he would need a 3T MRI. He will have [place] likely with Taylor Sanchez He will continue to follow up with Dr. MLisbeth Renshawfor his radiation treatment and Dr. SBenay Spicefor systemic management.  ____________________________________  Taylor Promise PhD, Taylor Sanchez    This document serves as a record of services personally performed by JGery Pray Taylor Sanchez. It was created on his behalf by ALendon Sanchez a trained medical scribe. The creation of this record is based on the scribe's personal observations and the provider's  statements to them. This document has been checked and approved by the attending provider.

## 2015-11-23 NOTE — Progress Notes (Signed)
  [facility] OFFICE PROGRESS NOTE   Diagnosis: Non-small cell lung cancer  INTERVAL HISTORY:   Taylor Sanchez returns as scheduled. An MRI of the brain on 11/18/2015 revealed a 6 mm metastasis in the right frontal lobe and a 3 mm left frontal lobe metastasis. He continues to have low back pain and right leg pain/numbness. The pain is much worse with standing and ambulation. He continues MS Contin. He takes oxycodone rarely. No difficulty with bowel function. Mild urinary hesitancy. He reports nausea and chills when he discontinued gabapentin. He believes gabapentin was helping the pain.  No diarrhea or rash.  Objective:  Vital signs in last 24 hours:  Blood pressure 104/66, pulse 86, temperature 98.4 F (36.9 C), temperature source Oral, resp. rate 18, height '6\' 1"'$  (1.854 m), weight 179 lb 14.4 oz (81.602 kg), SpO2 99 %.    HEENT: No thrush or ulcers Resp: Lungs clear bilaterally Cardio: Regular rate and rhythm GI: No hepatomegaly, nontender Vascular: No leg edema Neuro: Alert and oriented, the motor exam appears intact in the legs bilaterally  Skin: No rash    Lab Results:  Lab Results  Component Value Date   WBC 5.1 11/23/2015   HGB 11.4* 11/23/2015   HCT 32.8* 11/23/2015   MCV 85.6 11/23/2015   PLT 233 11/23/2015   NEUTROABS 3.8 11/23/2015  Potassium 4.2, creatinine 0.8, calcium 8.4    Medications: I have reviewed the patient's current medications.  Assessment/Plan: 1. Stage IV non-small cell lung cancer  MRI lumbar spine 10/06/2015, CT scans 10/10/2015 reveal multiple destructive bone lesions, chest lymphadenopathy, lung nodules/masses  CT-guided biopsy of L4 lesion 10/11/2015 confirmed poorly differentiated adenocarcinoma  Foundation 1 testing confirmed a ROS1 rearrangement Intron 23 , negative for EGFR and ALK alterations  Palliative radiation to the lumbar spine initiated 10/12/2015, completed 10/25/2015  Initiation of crizotinib  10/31/2015  MRI brain 11/18/1998 17-6 millimeter right frontal and 3 mm left frontal metastases 3. Probable postobstructive pneumonia in the left lower lobe on CT 10/10/2015 4. Small left lower right lower lobe pulmonary embolism on CT 10/10/2015, right leg DVT on a Doppler 10/12/2015-heparin initiated 10/10/2015, switched to Xarelto 10/13/2015  Disposition:  Taylor Sanchez is tolerating the crizotinib well. He continues to have pain in the low back/right leg with numbness in the right leg. He will begin a physical therapy program tomorrow. I suspect the pain is related to the pathologic compression fractures and tumor at the lumbar spine. He will continue MS Contin. He will resume gabapentin.  I discussed the brain MRI findings with him. We will refer him to radiation oncology to consider SRS to the 2 brain lesions.  Taylor Sanchez will return for an office and lab visit in 2 weeks.  Betsy Coder, MD  11/23/2015  12:26 PM

## 2015-11-24 ENCOUNTER — Other Ambulatory Visit: Payer: Self-pay | Admitting: Radiation Therapy

## 2015-11-24 DIAGNOSIS — C7949 Secondary malignant neoplasm of other parts of nervous system: Principal | ICD-10-CM

## 2015-11-24 DIAGNOSIS — C7931 Secondary malignant neoplasm of brain: Secondary | ICD-10-CM

## 2015-11-25 MED FILL — MORPHINE SULF 30 MG TAB SA: 30 | 30 days supply | Qty: 60 | Fill #0

## 2015-11-29 NOTE — Progress Notes (Deleted)
Recon  Stage IV non small cell cancer here now for  New Brain metastasis. 6 mm right frontal lobe and a 3 mm left frontal metastasis,  Past Palliative radiation  To the L  spine  10 Fractions  completed 10/25/15  Saw Dr. Sondra Come 11/23/15 for follow up visit   Takes Morphine '30mg'$   Twice a day,,, and Oxycontin '5mg'$  as needed ,Physical Therapy scheduled to start  11/24/15

## 2015-11-29 NOTE — Progress Notes (Signed)
Recon Stage IV non small cell cancer with bone mets now here for New Brain metastasis, 6 mm right frontal lobe and a 3 mm left frontal metastasis Past Radiation to L-Spine 10 Fractions completed 11/04/15,   Was seen By Dr. Sondra Come 11/23/15 for a follow up visit, referral to Dr. Lisbeth Renshaw for [facility]  Takes Morphine 30 mg oral twice day and Oxycontin 5 mg as needed breakthrough pain, Physical Therapy scheduled to start 11/24/15  patient using walker, left thigh pain with standing or sitting too long, no c/o nausea, or head aches,  Blurred vision halos when wakes upin am, dizzy ness someimes when waking up 1:03 PM BP 123/67 mmHg  Pulse 68  Temp(Src) 97.9 F (36.6 C) (Oral)  Resp 20  Ht '6\' 1"'$  (1.854 m)  Wt 184 lb 3.2 oz (83.553 kg)  BMI 24.31 kg/m2  SpO2 96%  Wt Readings from Last 3 Encounters:  11/30/15 184 lb 3.2 oz (83.553 kg)  11/23/15 179 lb 14.4 oz (81.602 kg)  11/09/15 179 lb 11.2 oz (81.511 kg)

## 2015-11-30 ENCOUNTER — Ambulatory Visit
Admission: RE | Admit: 2015-11-30 | Discharge: 2015-11-30 | Disposition: A | Discharge status: Home / Self Care | Payer: BC Managed Care – PPO | Source: Ambulatory Visit | Attending: Radiation Oncology | Admitting: Radiation Oncology

## 2015-11-30 ENCOUNTER — Encounter: Payer: Self-pay | Admitting: Radiation Therapy

## 2015-11-30 ENCOUNTER — Encounter: Payer: Self-pay | Admitting: Radiation Oncology

## 2015-11-30 VITALS — BP 123/67 | HR 68 | Temp 97.9°F | Resp 20 | Ht 73.0 in | Wt 184.2 lb

## 2015-11-30 DIAGNOSIS — C801 Malignant (primary) neoplasm, unspecified: Secondary | ICD-10-CM | POA: Diagnosis not present

## 2015-11-30 DIAGNOSIS — C7931 Secondary malignant neoplasm of brain: Secondary | ICD-10-CM | POA: Insufficient documentation

## 2015-11-30 DIAGNOSIS — Z510 Encounter for antineoplastic radiation therapy: Secondary | ICD-10-CM | POA: Insufficient documentation

## 2015-11-30 DIAGNOSIS — C7949 Secondary malignant neoplasm of other parts of nervous system: Secondary | ICD-10-CM

## 2015-11-30 DIAGNOSIS — C3492 Malignant neoplasm of unspecified part of left bronchus or lung: Secondary | ICD-10-CM

## 2015-11-30 MED ORDER — SODIUM CHLORIDE 0.9% FLUSH
10.0000 mL | Freq: Once | INTRAVENOUS | Status: AC
  Administered 2015-11-30: 10 mL via INTRAVENOUS

## 2015-11-30 NOTE — Progress Notes (Signed)
Radiation Oncology         [phone] ________________________________  Name: Taylor Sanchez MRN: [id number]  Date: 11/30/2015  DOB: April 19, 1976  Follow-Up Visit Note  CC: No primary care provider on file.  Taylor Pier, MD  Diagnosis:   Stage IV non small cell cancer lung cancer with bone mets and new brain metastasis   Interval Since Last Radiation:  Completed 10/25/15, one month.  Palliative Radiation to the Lumbar Spine, 10 fractions.   Past Medical History:  Past Medical History  Diagnosis Date  . Herniated lumbar disc without myelopathy   . Right-sided low back pain with right-sided sciatica   . Metastatic primary lung cancer ([place]) 10/11/2015  . Radiation 10/12/15-10/25/15    L3-sacrum 30 Gy   Taylor Sanchez is a 40 year old Caucasian male who presents to clinic today after being diagnosed with a new brain metastasis, 6 mm right frontal lobe and a 3 mm left frontal metastasis that was revealed from a PET scan on 11/13/2015. He has recently completed a round of radiotherapy with Taylor Sanchez for palliative radiotherapy to the lumbar spine at ten fractions, which was completed on 10/25/2015. After an initial consult with Taylor Sanchez,  he is here today to discuss treatment for his new metastasis.   NARRATIVE: Patient affirms spells of nausea and vomiting.  He reports that these are recent with bouts at 11 in the morning on the 17th  and 3 in the afternoon a few days later.   Patient also reports difficulty starting a stream and also a weak stream once he starts urinating. The patient denies changes in visual acuity except for whenever he has first waken up, where he reports that some objects have a "halo" and his vision is minimally blurry. The patient reports that sitting up for a long time produces pain in his back.     Past Surgical History: Past Surgical History  Procedure Laterality Date  . Vasectomy  2007    Social History:  Social History   Social History  . Marital  Status: Married    Spouse Name: N/A  . Number of Children: N/A  . Years of Education: N/A   Occupational History  . Not on file.   Social History Main Topics  . Smoking status: Former Smoker    Types: Cigarettes    Quit date: 10/09/2011  . Smokeless tobacco: Never Used  . Alcohol Use: Yes     Comment: Socially  . Drug Use: No  . Sexual Activity: Yes    Birth Control/ Protection: None   Other Topics Concern  . 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  His wife will be immigrating to the [place] in the next month from [place]   Family History:No family history on file.                               ALLERGIES:  is allergic to bee venom; penicillins; and prednisone.  Meds: Current Outpatient Prescriptions  Medication Sig Dispense Refill  . crizotinib (XALKORI) 250 MG capsule Take 1 capsule (250 mg total) by mouth 2 (two) times daily. 60 capsule 0  . cyclobenzaprine (FLEXERIL) 5 MG tablet Take 1 tablet (5 mg total) by mouth 2 (two) times daily as needed for muscle spasms. 30 tablet 0  .  gabapentin (NEURONTIN) 100 MG capsule TAKE 2 CAPSULES (200 MG TOTAL) BY MOUTH 3 (THREE) TIMES DAILY. 90 capsule 0  . levocetirizine (XYZAL) 5 MG tablet Take 5 mg by mouth every evening.   1  . morphine (MS CONTIN) 30 MG 12 hr tablet Take 1 tablet (30 mg total) by mouth every 12 (twelve) hours. 60 tablet 0  . Multiple Vitamins-Minerals (MULTIVITAMIN ADULT PO) Take 1 capsule by mouth daily.    [name] oxyCODONE (OXY IR/ROXICODONE) 5 MG immediate release tablet Take 1-2 tablets (5-10 mg total) by mouth every 4 (four) hours as needed for moderate pain. 40 tablet 0  . polyethylene glycol (MIRALAX / GLYCOLAX) packet Take 17 g by mouth daily. 14 each 0  . rivaroxaban (XARELTO) 20 MG TABS tablet Take 1 tablet (20 mg total) by mouth daily with supper. 30 tablet 4  . senna-docusate (SENOKOT-S) 8.6-50 MG  tablet Take 2 tablets by mouth 2 (two) times daily. 30 tablet 0  . dexamethasone (DECADRON) 4 MG tablet Take one daily for 3 days then take one every other day for 3 doses then stop. (Patient not taking: Reported on 11/23/2015) 30 tablet 0  . feeding supplement, ENSURE ENLIVE, (ENSURE ENLIVE) LIQD Take 237 mLs by mouth 2 (two) times daily between meals. (Patient not taking: Reported on 11/23/2015) 237 mL 12   No current facility-administered medications for this encounter.    Physical Findings: The patient is in no acute distress. Patient is alert and oriented.  height is '6\' 1"'$  (1.854 m) and weight is 184 lb 3.2 oz (83.553 kg). His oral temperature is 97.9 F (36.6 C). His blood pressure is 123/67 and his pulse is 68. His respiration is 20 and oxygen saturation is 96%. .    In general this is a well appearing Caucasian male in no acute distress. He is alert and oriented x4 and appropriate throughout the examination. HEENT reveals that the patient is normocephalic, atraumatic. EOMs are intact. PERRLA. Skin is intact without any evidence of gross lesions. Cardiovascular exam reveals a regular rate and rhythm, no clicks rubs or murmurs are auscultated. Chest is clear to auscultation bilaterally. Lymphatic assessment is performed and does not reveal any adenopathy in the cervical, supraclavicular, axillary, or inguinal chains. Abdomen has active bowel sounds in all quadrants and is intact. The abdomen is soft, non tender, non distended. Lower extremities are negative for pretibial pitting edema, deep calf tenderness, cyanosis or clubbing. Decreased motor ability of the left leg when exhibiting core strength, 3/4.  Otherwise, patient is neurologically intact. Patients reflexes are also intact.  ECOG =  3   0 - Asymptomatic (Fully active, able to carry on all predisease activities without restriction)  1 - Symptomatic but completely ambulatory (Restricted in physically strenuous activity but ambulatory and  able to carry out work of a light or sedentary nature. For example, light housework, office work)  2 - Symptomatic, <50% in bed during the day (Ambulatory and capable of all self care but unable to carry out any work activities. Up and about more than 50% of waking hours)  3 - Symptomatic, >50% in bed, but not bedbound (Capable of only limited self-care, confined to bed or chair 50% or more of waking hours)  4 - Bedbound (Completely disabled. Cannot carry on any self-care. Totally confined to bed or chair)  5 - Death   Eustace Pen MM, Creech RH, Tormey DC, et al. [id number]). "Toxicity and response criteria of the [facility]". [place]  Oncol. 5 (6): 649-55   Lab Findings: Lab Results  Component Value Date   WBC 5.1 11/23/2015   HGB 11.4* 11/23/2015   HCT 32.8* 11/23/2015   MCV 85.6 11/23/2015   PLT 233 11/23/2015     Radiographic Findings: Mr Jeri Cos Wo Contrast  11/18/2015  CLINICAL DATA:  Metastatic lung cancer staging EXAM: MRI HEAD WITHOUT AND WITH CONTRAST TECHNIQUE: Multiplanar, multiecho pulse sequences of the brain and surrounding structures were obtained without and with intravenous contrast. CONTRAST:  16m MULTIHANCE GADOBENATE DIMEGLUMINE 529 MG/ML IV SOLN COMPARISON:  None. FINDINGS: Ventricle size normal.  Cerebral volume normal. 6 mm enhancing metastatic deposit right frontal white matter. Mild surrounding white matter. 3 mm enhancing nodule left frontal lobe compatible with metastatic disease. No other enhancing metastatic deposits in the brain Negative for acute infarct.  Brainstem and basal ganglia normal. Small areas susceptibility left anterior cerebellum consistent with calcified choroid plexus. This shows typical enhancement and location for choroid plexus. Negative for intracranial hemorrhage. No shift of the midline structures. Mild mucosal edema in the paranasal sinuses.  No orbital lesion. Pituitary and skull base normal.  Calvarium intact.  IMPRESSION: Two metastatic deposits the brain, in the right frontal lobe and left frontal lobe. Electronically Signed   By: CFranchot GalloM.D.   On: 11/18/2015 13:14    Impression:    Stage IV non small cell cancer lung cancer with bone mets and new brain metastasis   Plan:  The patient will proceed with CT SIM for treatment [facility] at 2.  We will treat both of these areas to 20gy with one fraction.    ACarola Rhine PAC   This document serves as a record of services personally performed by AShona Simpson PA-C and JKyung Rudd MD, PhD. It was created on their behalf by TTruddie Hidden a trained medical scribe. The creation of this record is based on the scribe's personal observations and the providers' statements to them. This document has been checked and approved by the attending provider.

## 2015-11-30 NOTE — Progress Notes (Signed)
Please see the Nurse Progress Note in the MD Initial Consult Encounter for this patient. 

## 2015-11-30 NOTE — Progress Notes (Signed)
After the successful injection of IV contrast for Taylor Sanchez's SRS Brain SIM, I removed his IV. It was intact without any issues or excessive bleeding. He was then bandaged and sent home.   Mont Dutton R.T.(R)(T) Special Procedures Navigator

## 2015-11-30 NOTE — Progress Notes (Addendum)
Does patient have an allergy to IV contrast dye?: No.   Has patient ever received premedication for IV contrast dye?: No.   Does patient take metformin?: No.  If patient does take metformin when was the last dose: not diabetic  Date of lab work: 11/23/2015 BUN: 11.1 CR: 0.8  IV site: LAC x 1 attempt, ex ellent blood return, flushed with 68m normal saline, taped and opsiite applied,patient toleratwed well, no c/o pain, escorted via w/c with NMelida Gimeneztherapist to Ct simulation 2:05 PM   IV removed by SMont Dutton  catheter tip intact, dressing gause and taped over LAC 3:24 PM

## 2015-12-01 ENCOUNTER — Other Ambulatory Visit: Payer: Self-pay | Admitting: Radiation Oncology

## 2015-12-01 ENCOUNTER — Ambulatory Visit
Admission: RE | Admit: 2015-12-01 | Discharge: 2015-12-01 | Disposition: A | Discharge status: Home / Self Care | Payer: BC Managed Care – PPO | Source: Ambulatory Visit | Attending: Radiation Oncology | Admitting: Radiation Oncology

## 2015-12-01 DIAGNOSIS — C7931 Secondary malignant neoplasm of brain: Secondary | ICD-10-CM

## 2015-12-01 DIAGNOSIS — C7949 Secondary malignant neoplasm of other parts of nervous system: Principal | ICD-10-CM

## 2015-12-01 MED ORDER — LORAZEPAM 0.5 MG PO TABS: ORAL_TABLET | ORAL | Status: AC

## 2015-12-01 MED ORDER — GADOBENATE DIMEGLUMINE 529 MG/ML IV SOLN
17.0000 mL | Freq: Once | INTRAVENOUS | Status: AC | PRN
  Administered 2015-12-01: 17 mL via INTRAVENOUS

## 2015-12-02 ENCOUNTER — Telehealth: Payer: Self-pay | Admitting: Radiation Oncology

## 2015-12-02 DIAGNOSIS — Z510 Encounter for antineoplastic radiation therapy: Secondary | ICD-10-CM | POA: Diagnosis not present

## 2015-12-02 NOTE — Telephone Encounter (Signed)
I spoke with the patient to let him know about the two additional brain lesions seen on his MRI scan. I discussed with him the need to include those in treatment. We discussed the use of the ativan for anxiety and claustrophobia during treatment. He will keep [place] informed of any questions or concerns that arise prior to therapy.

## 2015-12-02 NOTE — Telephone Encounter (Signed)
LM for pt to call us back. I also called his mom's number and LM.

## 2015-12-03 NOTE — Progress Notes (Signed)
  Radiation Oncology         ([phone]) [phone] ________________________________  Name: Taylor Sanchez MRN: [id number]  Date: 10/25/2015  DOB: [birth year]  End of Treatment Note  Diagnosis:   Bony metastasis     Indication for treatment::  palliative       Radiation treatment dates:   10/12/2015 through 10/25/2015  Site/dose:   The patient was treated with a palliative course of radiation treatment to the lumbar spine to a dose of 30 gray in 10 fractions at 3 gray per fraction.  Narrative: The patient tolerated radiation treatment relatively well.     Plan: The patient has completed radiation treatment. The patient will return to radiation oncology clinic for routine followup in one month. I advised the patient to call or return sooner if they have any questions or concerns related to their recovery or treatment. ________________________________  Jodelle Gross, M.D., Ph.D.

## 2015-12-05 DIAGNOSIS — Z510 Encounter for antineoplastic radiation therapy: Secondary | ICD-10-CM | POA: Diagnosis not present

## 2015-12-06 DIAGNOSIS — Z510 Encounter for antineoplastic radiation therapy: Secondary | ICD-10-CM | POA: Diagnosis not present

## 2015-12-07 ENCOUNTER — Other Ambulatory Visit (HOSPITAL_BASED_OUTPATIENT_CLINIC_OR_DEPARTMENT_OTHER): Payer: BC Managed Care – PPO

## 2015-12-07 ENCOUNTER — Ambulatory Visit (HOSPITAL_BASED_OUTPATIENT_CLINIC_OR_DEPARTMENT_OTHER): Payer: BC Managed Care – PPO | Admitting: Nurse Practitioner

## 2015-12-07 ENCOUNTER — Telehealth: Payer: Self-pay | Admitting: Nurse Practitioner

## 2015-12-07 VITALS — BP 133/74 | HR 73 | Temp 98.1°F | Resp 20 | Ht 73.0 in | Wt 186.9 lb

## 2015-12-07 DIAGNOSIS — C3490 Malignant neoplasm of unspecified part of unspecified bronchus or lung: Secondary | ICD-10-CM

## 2015-12-07 DIAGNOSIS — C7951 Secondary malignant neoplasm of bone: Secondary | ICD-10-CM

## 2015-12-07 DIAGNOSIS — C7931 Secondary malignant neoplasm of brain: Secondary | ICD-10-CM | POA: Diagnosis not present

## 2015-12-07 DIAGNOSIS — C3492 Malignant neoplasm of unspecified part of left bronchus or lung: Secondary | ICD-10-CM

## 2015-12-07 LAB — CBC WITH DIFFERENTIAL/PLATELET
BASO%: 0.9 % (ref 0.0–2.0)
BASOS ABS: 0 10*3/uL (ref 0.0–0.1)
EOS%: 1.7 % (ref 0.0–7.0)
Eosinophils Absolute: 0.1 10*3/uL (ref 0.0–0.5)
HEMATOCRIT: 33.9 % — AB (ref 38.4–49.9)
HGB: 11.2 g/dL — ABNORMAL LOW (ref 13.0–17.1)
LYMPH#: 0.9 10*3/uL (ref 0.9–3.3)
LYMPH%: 17.5 % (ref 14.0–49.0)
MCH: 28.4 pg (ref 27.2–33.4)
MCHC: 33 g/dL (ref 32.0–36.0)
MCV: 86 fL (ref 79.3–98.0)
MONO#: 0.5 10*3/uL (ref 0.1–0.9)
MONO%: 9.9 % (ref 0.0–14.0)
NEUT#: 3.8 10*3/uL (ref 1.5–6.5)
NEUT%: 70 % (ref 39.0–75.0)
PLATELETS: 381 10*3/uL (ref 140–400)
RBC: 3.94 10*6/uL — ABNORMAL LOW (ref 4.20–5.82)
RDW: 16.8 % — ABNORMAL HIGH (ref 11.0–14.6)
WBC: 5.4 10*3/uL (ref 4.0–10.3)

## 2015-12-07 LAB — COMPREHENSIVE METABOLIC PANEL
ALT: 24 U/L (ref 0–55)
ANION GAP: 7 meq/L (ref 3–11)
AST: 31 U/L (ref 5–34)
Albumin: 2.5 g/dL — ABNORMAL LOW (ref 3.5–5.0)
Alkaline Phosphatase: 285 U/L — ABNORMAL HIGH (ref 40–150)
BUN: 11.8 mg/dL (ref 7.0–26.0)
CALCIUM: 8.3 mg/dL — AB (ref 8.4–10.4)
CHLORIDE: 108 meq/L (ref 98–109)
CO2: 27 mEq/L (ref 22–29)
CREATININE: 0.8 mg/dL (ref 0.7–1.3)
Glucose: 94 mg/dl (ref 70–140)
POTASSIUM: 4.1 meq/L (ref 3.5–5.1)
Sodium: 143 mEq/L (ref 136–145)
Total Bilirubin: 0.34 mg/dL (ref 0.20–1.20)
Total Protein: 5.2 g/dL — ABNORMAL LOW (ref 6.4–8.3)

## 2015-12-07 MED ORDER — GABAPENTIN 100 MG PO CAPS
ORAL_CAPSULE | ORAL | 0 refills | Status: DC
Start: 2015-12-07 — End: 2016-01-02

## 2015-12-07 NOTE — Telephone Encounter (Signed)
per pof to sch pt appt-gave pt contrast and sch/cal-adv central sch will call to sch scan

## 2015-12-07 NOTE — Progress Notes (Addendum)
  [facility] OFFICE PROGRESS NOTE   Diagnosis:  Non-small cell lung cancer  INTERVAL HISTORY:   Mr. Calo returns as scheduled. He continues crizotinib. He is scheduled for SRS to the brain lesions 12/12/2015.  Low back pain and right leg pain have improved since resuming gabapentin. He notes improvement in right leg weakness as well. He continues to have numbness in the right leg. He is working with physical therapy twice a week. He has had 2 bouts of mild nausea over the past few months. One episode of diarrhea. No rash. He feels "winded" periodically. No associated symptoms. He denies any bleeding. Mobility continues to be improved.  Objective:  Vital signs in last 24 hours:  Blood pressure 133/74, pulse 73, temperature 98.1 F (36.7 C), temperature source Oral, resp. rate 20, height '6\' 1"'$  (1.854 m), weight 186 lb 14.4 oz (84.8 kg), SpO2 97 %.    HEENT: Tiny healing ulceration right buccal mucosa. Resp: Lungs clear bilaterally. No respiratory distress. Cardio: Regular rate and rhythm. GI: Abdomen soft and nontender. No hepatomegaly. Vascular: No leg edema. Calves soft and nontender. Neuro: Alert and oriented. Lower extremity motor strength intact.  Skin: No rash.    Lab Results:  Lab Results  Component Value Date   WBC 5.4 12/07/2015   HGB 11.2 (L) 12/07/2015   HCT 33.9 (L) 12/07/2015   MCV 86.0 12/07/2015   PLT 381 12/07/2015   NEUTROABS 3.8 12/07/2015    Imaging:  No results found.  Medications: I have reviewed the patient's current medications.  Assessment/Plan: 1. Stage IV non-small cell lung cancer  MRI lumbar spine 10/06/2015, CT scans 10/10/2015 reveal multiple destructive bone lesions, chest lymphadenopathy, lung nodules/masses  CT-guided biopsy of L4 lesion 10/11/2015 confirmed poorly differentiated adenocarcinoma  Foundation 1 testing confirmed a ROS1 rearrangement Intron 23 , negative for EGFR and ALK alterations  Palliative  radiation to the lumbar spine initiated 10/12/2015, completed 10/25/2015  Initiation of crizotinib 10/31/2015  MRI brain 11/18/2015-6 millimeter right frontal and 3 mm left frontal metastases 3. Probable postobstructive pneumonia in the left lower lobe on CT 10/10/2015 4. Small left lower right lower lobe pulmonary embolism on CT 10/10/2015, right leg DVT on a Doppler 10/12/2015-heparin initiated 10/10/2015, switched to Xarelto 10/13/2015   Disposition: Mr. Petron appears stable. He will continue crizotinib. The plan is for restaging CT scans in approximately 3 weeks which will be at a two-month interval since beginning Crizotinib.   He will return for a follow-up visit on 12/28/2015 to review the results. He will contact the office in the interim with any problems.  A new prescription was sent to his pharmacy for gabapentin one-month supply.  Patient seen with Dr. Benay Spice. 25 minutes were spent face-to-face at today's visit with the majority of that time involved in counseling/coordination of care.  Ned Card ANP/GNP-BC   12/07/2015  12:14 PM  This was a shared visit with Ned Card. Mr. Heslin is tolerating the crizotinib well. He will complete SRS to the brain lesions next week.  He will be rescheduled for a restaging CT evaluation prior to an office visit 12/28/2015.  Julieanne Manson, M.D.

## 2015-12-08 LAB — PHOSPHORUS: Phosphorus, Ser: 4.4 mg/dL (ref 2.5–4.5)

## 2015-12-09 ENCOUNTER — Telehealth: Payer: Self-pay | Admitting: *Deleted

## 2015-12-09 NOTE — Telephone Encounter (Signed)
Message received from patient stating that he had just vomited and wanted to let us know.  Dr. Benay Spice notified.  Call placed back to patient and pt states that after vomiting he took a nap and woke up feeling much better.  He denies any dizziness, lightheadedness, headaches, SOB or pain at this time.  Pt instructed per Dr. Benay Spice to call back to office if any other symptoms arise.  Pt verbalizes an understanding of MD instructions and appreciative of call back.

## 2015-12-09 NOTE — Telephone Encounter (Signed)
Call received from patient stating that he woke up several times last night with a pain in his esophagus/breast bone.  He states that the pain is worse when he is lying down, is a throbbing pain and rates it a "4" out of 10.  He denies any SOB, nausea, no difficulties swallowing or mouth sores.  Per Dr. Benay Spice, pt may take Maalox as directed on bottle and is to call office back with any other worsening symptoms or complaints.  Pt instructed of MD orders and states that he has already taken a Tums tablet which has helped his discomfort. Pt verbalizes an understanding of MD orders and is appreciative of call back.  Pt has no further questions or concerns at this time.

## 2015-12-12 ENCOUNTER — Encounter: Payer: Self-pay | Admitting: Radiation Oncology

## 2015-12-12 ENCOUNTER — Ambulatory Visit
Admission: RE | Admit: 2015-12-12 | Discharge: 2015-12-12 | Disposition: A | Discharge status: Home / Self Care | Payer: BC Managed Care – PPO | Source: Ambulatory Visit | Attending: Radiation Oncology | Admitting: Radiation Oncology

## 2015-12-12 VITALS — BP 112/56 | HR 72 | Temp 98.3°F | Resp 18

## 2015-12-12 DIAGNOSIS — Z510 Encounter for antineoplastic radiation therapy: Secondary | ICD-10-CM | POA: Diagnosis not present

## 2015-12-12 DIAGNOSIS — C7931 Secondary malignant neoplasm of brain: Secondary | ICD-10-CM

## 2015-12-12 NOTE — Progress Notes (Signed)
  Radiation Oncology         [phone] ________________________________  Name: Taylor Sanchez MRN: [id number]  Date: 12/12/2015  DOB: 1975/09/29  End of Treatment Note  Diagnosis:      ICD-9-CM ICD-10-CM   1. Brain metastases ([place]) 198.3 C79.31         Indication for treatment:  palliative       Radiation treatment dates:   12/12/2015  Site/dose:    1.  PTV1 Rt Sup Cerebellum 20m target was treated using 2 Arcs to a prescription dose of 20 Gy. ExacTrac Snap verification was performed for each couch angle.  2.  PTV2 Ant Rt Frontal [id number] was treated using 2 Arcs to a prescription dose of 20 Gy. ExacTrac Snap verification was performed for each couch angle. 3.  PTV3 Sup Rt Occipital 64m80marget was treated using 3 Arcs to a prescription dose of 20 Gy. ExacTrac Snap verification was performed for each couch angle.  4.  PTV4 Ant Sup Lt Frontal [place] [id number] was treated using 3 Arcs to a prescription dose of 20 Gy. ExacTrac Snap verification was performed for each couch angle.  Narrative: The patient tolerated radiation treatment well.   There were no signs of acute toxicity after treatment.  Plan: The patient has completed radiation treatment. The patient will return to radiation oncology clinic for routine followup in one month. I advised the patient to call or return sooner if they have any questions or concerns related to their recovery or treatment. ________________________________  ------------------------------------------------  JohnJodelle Gross, PhD

## 2015-12-12 NOTE — Progress Notes (Signed)
  Radiation Oncology         [phone]) [phone] ________________________________  Name: Taylor Sanchez MRN: [id number]  Date: 12/12/2015  DOB: 10/01/1975   SPECIAL TREATMENT PROCEDURE   3D TREATMENT PLANNING AND DOSIMETRY: The patient's radiation plan was reviewed and approved by Dr. Elenor Legato from neurosurgery and radiation oncology prior to treatment. It showed 3-dimensional radiation distributions overlaid onto the planning CT/MRI image set. The [facility] for the target structures as well as the organs at risk were reviewed. The documentation of the 3D plan and dosimetry are filed in the radiation oncology EMR.   NARRATIVE: The patient was brought to the TrueBeam stereotactic radiation treatment machine and placed supine on the CT couch. The head frame was applied, and the patient was set up for stereotactic radiosurgery. Neurosurgery was present for the set-up and delivery   SIMULATION VERIFICATION: In the couch zero-angle position, the patient underwent Exactrac imaging using the Brainlab system with orthogonal KV images. These were carefully aligned and repeated to confirm treatment position for each of the isocenters. The Exactrac snap film verification was repeated at each couch angle.   SPECIAL TREATMENT PROCEDURE: The patient received stereotactic radiosurgery to the following targets:  1.  PTV1 Rt Sup Cerebellum 28m target was treated using 2 Arcs to a prescription dose of 20 Gy. ExacTrac Snap verification was performed for each couch angle.  2.  PTV2 Ant Rt Frontal 524mtarget was treated using 2 Arcs to a prescription dose of 20 Gy. ExacTrac Snap verification was performed for each couch angle. 3.  PTV3 Sup Rt Occipital 54m69marget was treated using 3 Arcs to a prescription dose of 20 Gy. ExacTrac Snap verification was performed for each couch angle.  4.  PTV4 Ant Sup Lt Frontal Gyrus 54mm21mrget was treated using 3 Arcs to a prescription dose of 20 Gy. ExacTrac Snap verification was performed  for each couch angle.   STEREOTACTIC TREATMENT MANAGEMENT: Following delivery, the patient was transported to nursing in stable condition and monitored for possible acute effects. Vital signs were recorded . The patient tolerated treatment without significant acute effects, and was discharged to home in stable condition.  PLAN: Follow-up in one month.   ------------------------------------------------  JohnJodelle Gross, PhD

## 2015-12-12 NOTE — Progress Notes (Signed)
Patient here for post [employer] monitoring.  He denies having pain, dizziness, nausea or double vision.  Call light in reach and family present in the room.  Able to ambulate upstairs to get the car with his mother.  Asked to call if he has any problems per Elmo Putt, R.N.

## 2015-12-12 NOTE — Progress Notes (Signed)
  Radiation Oncology         [phone] ________________________________  Name: Taylor Sanchez MRN: [id number]  Date: 11/30/2015  DOB: 1976/02/19  DIAGNOSIS:     ICD-9-CM ICD-10-CM   1. Brain metastases ([place]) 198.3 C79.31     NARRATIVE:  The patient was brought to the [facility].  Identity was confirmed.  All relevant records and images related to the planned course of therapy were reviewed.  The patient freely provided informed written consent to proceed with treatment after reviewing the details related to the planned course of therapy. The consent form was witnessed and verified by the simulation staff. Intravenous access was established for contrast administration. Then, the patient was set-up in a stable reproducible supine position for radiation therapy.  A relocatable thermoplastic stereotactic head frame was fabricated for precise immobilization.  CT images were obtained.  Surface markings were placed.  The CT images were loaded into the planning software and fused with the patient's targeting MRI scan.  Then the target and avoidance structures were contoured.  Treatment planning then occurred.  The radiation prescription was entered and confirmed.  I have requested 3D planning  I have requested a DVH of the following structures: Brain stem, brain, left eye, right eye, lenses, optic chiasm, target volumes, uninvolved brain, and normal tissue.    SPECIAL TREATMENT PROCEDURE:  The planned course of therapy using radiation constitutes a special treatment procedure. Special care is required in the management of this patient for the following reasons. This treatment constitutes a Special Treatment Procedure for the following reason: High dose per fraction requiring special monitoring for increased toxicities of treatment including daily imaging.  The special nature of the planned course of radiotherapy will require increased physician supervision and oversight to ensure patient's  safety with optimal treatment outcomes.  PLAN:  The patient will receive 20 Gy in 1 fraction to 4 intracranial lesions.   ------------------------------------------------  Jodelle Gross, MD, PhD

## 2015-12-12 NOTE — Progress Notes (Signed)
Patient here for post [facility] monitoring.  He denies having pain, dizziness, nausea or double vision.  Call light in reach and family present in the room.  Will continue to monitor.

## 2015-12-14 ENCOUNTER — Ambulatory Visit
Admission: RE | Admit: 2015-12-14 | Payer: BC Managed Care – PPO | Source: Ambulatory Visit | Admitting: Radiation Oncology

## 2015-12-15 NOTE — Op Note (Signed)
  Name: Taylor Sanchez  MRN: [id number]  Date: 12/12/2015   DOB: [birth year]  Stereotactic Radiosurgery Operative Note  PRE-OPERATIVE DIAGNOSIS:  Multiple Brain Metastases  POST-OPERATIVE DIAGNOSIS:  Multiple Brain Metastases  PROCEDURE:  Stereotactic Radiosurgery  SURGEON:  Jairo Ben, MD  RADIATION ONCOLOGIST: Dr. Kyung Rudd, MD  NARRATIVE: The patient underwent a radiation treatment planning session in the radiation oncology simulation suite under the care of the radiation oncology physician and physicist.  I participated closely in the radiation treatment planning afterwards. The patient underwent planning CT which was fused to 3T high resolution MRI with 1 mm axial slices.  These images were fused on the planning system.  We contoured the gross target volumes and subsequently expanded this to yield the Planning Target Volume. I actively participated in the planning process.  I helped to define and review the target contours and also the contours of the optic pathway, eyes, brainstem and selected nearby organs at risk.  All the dose constraints for critical structures were reviewed and compared to AAPM Task Group 101.  The prescription dose conformity was reviewed.  I approved the plan electronically.    Accordingly, Taylor Sanchez was brought to the TrueBeam stereotactic radiation treatment linac and placed in the custom immobilization mask.  The patient was aligned according to the IR fiducial markers with BrainLab Exactrac, then orthogonal x-rays were used in ExacTrac with the 6DOF robotic table and the shifts were made to align the patient  Taylor Sanchez received stereotactic radiosurgery uneventfully.    Lesions treated:  4:  Right cerebellar to 20Gy  Right frontal to 20Gy  Right occipital to 20Gy  Left frontal to 20Gy  The detailed description of the procedure is recorded in the radiation oncology procedure note.  I was present for the duration of the  procedure.  DISPOSITION:  Following delivery, the patient was transported to nursing in stable condition and monitored for possible acute effects to be discharged to home in stable condition with follow-up in one month.  Jairo Ben, MD 12/15/2015 5:14 PM

## 2015-12-15 NOTE — Addendum Note (Signed)
Encounter addended by: Consuella Lose, MD on: 12/15/2015  5:15 PM<BR>    Actions taken: Sign clinical note

## 2015-12-19 ENCOUNTER — Telehealth: Payer: Self-pay | Admitting: *Deleted

## 2015-12-19 NOTE — Telephone Encounter (Signed)
Message from pt requesting referral to [place] at [facility] in [place] for PT. He is moving back to [facility] and will be returning to work. Pt is also requesting handicap pass to be completed for his work. Gave fax # for him to have form sent for completion.

## 2015-12-21 ENCOUNTER — Other Ambulatory Visit: Payer: Self-pay | Admitting: *Deleted

## 2015-12-21 ENCOUNTER — Telehealth: Payer: Self-pay | Admitting: *Deleted

## 2015-12-21 NOTE — Telephone Encounter (Signed)
Call placed to pt to inform him that [facility] parking pass forms have been signed by Dr. Benay Spice and faxed.  Also informed pt that order for PT with Gerda Diss was placed today.  Pt had questions regarding how to take CT contrast.  Instructions given to pt and pt verbalized an understanding of information.  Call placed to [place] for prior auth for CT scan scheduled for 12/27/15.

## 2015-12-21 NOTE — Telephone Encounter (Signed)
Pt notified that CT scan appt set for 12/27/15 at 12:30PM.  Will change lab appt to 11:30AM.  Pt appreciative of call back.

## 2015-12-22 ENCOUNTER — Telehealth: Payer: Self-pay | Admitting: *Deleted

## 2015-12-22 NOTE — Telephone Encounter (Signed)
Call received from pt stating that his "feet, ankles and lower legs are swelling and are uncomfortable".  Dr. Benay Spice notified and order received to instruct pt to keep his legs elevated and to use support stockings.  Call placed to pt with MD orders, pt verbalizes an understanding of information.  Pt instructed to call office back if swelling does not improve or if other complaints arise.  Pt appreciative of call.

## 2015-12-23 NOTE — Progress Notes (Incomplete)
°  Radiation Oncology         [phone]) [phone] ________________________________  Name: Taylor Sanchez MRN: [id number]  Date: 12/12/2015  DOB: [birth year]  End of Treatment Note  Diagnosis:      ICD-9-CM ICD-10-CM   1. Brain metastases ([place]) 198.3 C79.31        Indication for treatment:  Palliative       Radiation treatment dates:   12/12/2015  Site/dose:    1. The Right Sup Cerebellum 2 mm was treated to 20 Gy in 1 fraction. 2. The Right Sup Occipital 3 mm was treated to 20 Gy in 1 fraction. 3. The Left Ant Sup Frontal Gyrus 3 mm was treated to 20 Gy in 1 fraction. 4. The Right Ant Frontal 5 mm was treated to 20 Gy in 1 fraction.  Beams/energy:    1. SBRT/SRT-3D // 6FFF 2. SBRT/SRT-3D // 6FFF 3. SBRT/SRT-3D // 6FFF 4. SBRT/SRT-3D // 6FFF  Narrative: The patient tolerated SBRT relatively well.   The patient had no issues with treatment.  Plan: The patient has completed radiation treatment. The patient will return to radiation oncology clinic for routine followup in one month. I advised them to call or return sooner if they have any questions or concerns related to their recovery or treatment.  ------------------------------------------------  Jodelle Gross, MD, PhD  This document serves as a record of services personally performed by Kyung Rudd, MD. It was created on his behalf by Arlyce Harman, a trained medical scribe. The creation of this record is based on the scribe's personal observations and the provider's statements to them. This document has been checked and approved by the attending provider.

## 2015-12-27 ENCOUNTER — Ambulatory Visit (HOSPITAL_COMMUNITY)
Admission: RE | Admit: 2015-12-27 | Discharge: 2015-12-27 | Disposition: A | Discharge status: Home / Self Care | Payer: BC Managed Care – PPO | Source: Ambulatory Visit | Attending: Nurse Practitioner | Admitting: Nurse Practitioner

## 2015-12-27 ENCOUNTER — Other Ambulatory Visit (HOSPITAL_BASED_OUTPATIENT_CLINIC_OR_DEPARTMENT_OTHER): Payer: BC Managed Care – PPO

## 2015-12-27 DIAGNOSIS — J90 Pleural effusion, not elsewhere classified: Secondary | ICD-10-CM | POA: Insufficient documentation

## 2015-12-27 DIAGNOSIS — C7951 Secondary malignant neoplasm of bone: Secondary | ICD-10-CM | POA: Diagnosis not present

## 2015-12-27 DIAGNOSIS — C3490 Malignant neoplasm of unspecified part of unspecified bronchus or lung: Secondary | ICD-10-CM

## 2015-12-27 DIAGNOSIS — R195 Other fecal abnormalities: Secondary | ICD-10-CM | POA: Insufficient documentation

## 2015-12-27 DIAGNOSIS — C3492 Malignant neoplasm of unspecified part of left bronchus or lung: Secondary | ICD-10-CM

## 2015-12-27 DIAGNOSIS — Z9889 Other specified postprocedural states: Secondary | ICD-10-CM | POA: Diagnosis not present

## 2015-12-27 LAB — CBC WITH DIFFERENTIAL/PLATELET
BASO%: 0.5 % (ref 0.0–2.0)
BASOS ABS: 0 10*3/uL (ref 0.0–0.1)
EOS ABS: 0.1 10*3/uL (ref 0.0–0.5)
EOS%: 3.4 % (ref 0.0–7.0)
HEMATOCRIT: 34.5 % — AB (ref 38.4–49.9)
HEMOGLOBIN: 11.5 g/dL — AB (ref 13.0–17.1)
LYMPH#: 0.7 10*3/uL — AB (ref 0.9–3.3)
LYMPH%: 18.6 % (ref 14.0–49.0)
MCH: 28 pg (ref 27.2–33.4)
MCHC: 33.2 g/dL (ref 32.0–36.0)
MCV: 84.1 fL (ref 79.3–98.0)
MONO#: 0.4 10*3/uL (ref 0.1–0.9)
MONO%: 9.8 % (ref 0.0–14.0)
NEUT#: 2.7 10*3/uL (ref 1.5–6.5)
NEUT%: 67.7 % (ref 39.0–75.0)
PLATELETS: 326 10*3/uL (ref 140–400)
RBC: 4.1 10*6/uL — ABNORMAL LOW (ref 4.20–5.82)
RDW: 17 % — AB (ref 11.0–14.6)
WBC: 3.9 10*3/uL — ABNORMAL LOW (ref 4.0–10.3)

## 2015-12-27 LAB — COMPREHENSIVE METABOLIC PANEL
ALBUMIN: 2.6 g/dL — AB (ref 3.5–5.0)
ALK PHOS: 176 U/L — AB (ref 40–150)
ALT: 20 U/L (ref 0–55)
AST: 30 U/L (ref 5–34)
Anion Gap: 5 mEq/L (ref 3–11)
BUN: 12.4 mg/dL (ref 7.0–26.0)
CALCIUM: 8.9 mg/dL (ref 8.4–10.4)
CO2: 29 mEq/L (ref 22–29)
Chloride: 108 mEq/L (ref 98–109)
Creatinine: 0.8 mg/dL (ref 0.7–1.3)
Glucose: 91 mg/dl (ref 70–140)
POTASSIUM: 5.1 meq/L (ref 3.5–5.1)
Sodium: 142 mEq/L (ref 136–145)
Total Bilirubin: 0.39 mg/dL (ref 0.20–1.20)
Total Protein: 5.2 g/dL — ABNORMAL LOW (ref 6.4–8.3)

## 2015-12-27 MED ORDER — IOPAMIDOL (ISOVUE-300) INJECTION 61%
100.0000 mL | Freq: Once | INTRAVENOUS | Status: AC | PRN
  Administered 2015-12-27: 100 mL via INTRAVENOUS

## 2015-12-28 ENCOUNTER — Other Ambulatory Visit: Payer: Self-pay | Admitting: Pharmacist

## 2015-12-28 ENCOUNTER — Telehealth: Payer: Self-pay | Admitting: Oncology

## 2015-12-28 ENCOUNTER — Ambulatory Visit (HOSPITAL_BASED_OUTPATIENT_CLINIC_OR_DEPARTMENT_OTHER): Payer: BC Managed Care – PPO | Admitting: Oncology

## 2015-12-28 ENCOUNTER — Ambulatory Visit (HOSPITAL_BASED_OUTPATIENT_CLINIC_OR_DEPARTMENT_OTHER): Payer: BC Managed Care – PPO

## 2015-12-28 DIAGNOSIS — C7949 Secondary malignant neoplasm of other parts of nervous system: Secondary | ICD-10-CM

## 2015-12-28 DIAGNOSIS — C3490 Malignant neoplasm of unspecified part of unspecified bronchus or lung: Secondary | ICD-10-CM | POA: Diagnosis not present

## 2015-12-28 DIAGNOSIS — C7951 Secondary malignant neoplasm of bone: Secondary | ICD-10-CM

## 2015-12-28 DIAGNOSIS — C801 Malignant (primary) neoplasm, unspecified: Secondary | ICD-10-CM

## 2015-12-28 DIAGNOSIS — D492 Neoplasm of unspecified behavior of bone, soft tissue, and skin: Secondary | ICD-10-CM

## 2015-12-28 DIAGNOSIS — C7931 Secondary malignant neoplasm of brain: Secondary | ICD-10-CM

## 2015-12-28 LAB — PHOSPHORUS: Phosphorus, Ser: 4.7 mg/dL — ABNORMAL HIGH (ref 2.5–4.5)

## 2015-12-28 MED ORDER — ZOLEDRONIC ACID 4 MG/100ML IV SOLN
4.0000 mg | Freq: Once | INTRAVENOUS | Status: AC
  Administered 2015-12-28: 4 mg via INTRAVENOUS
  Filled 2015-12-28: qty 100

## 2015-12-28 MED ORDER — CRIZOTINIB 250 MG PO CAPS: 250.0000 mg | ORAL_CAPSULE | Freq: Two times a day (BID) | ORAL | 0 refills | Status: AC

## 2015-12-28 MED ORDER — MORPHINE SULFATE ER 30 MG PO TBCR: EXTENDED_RELEASE_TABLET | ORAL | 0 refills | Status: DC

## 2015-12-28 MED ORDER — RIVAROXABAN 20 MG PO TABS: 20.0000 mg | ORAL_TABLET | Freq: Every day | ORAL | 2 refills | Status: AC

## 2015-12-28 MED ORDER — SODIUM CHLORIDE 0.9 % IV SOLN
Freq: Once | INTRAVENOUS | Status: AC
  Administered 2015-12-28: 13:00:00 via INTRAVENOUS

## 2015-12-28 MED FILL — *XALKORI 250MG CAPSULE: 250 | 30 days supply | Qty: 60 | Fill #0

## 2015-12-28 MED FILL — XARELTO 20 MG TABLET: 20 | 30 days supply | Qty: 30 | Fill #0

## 2015-12-28 NOTE — Patient Instructions (Signed)

## 2015-12-28 NOTE — Telephone Encounter (Signed)
Gave patient avs report and appointments for September. Per BS ok with LT due to BS has jury duty.    Dr. Kathyrn Sheriff - [facility] 01/02/2016 @ 10:45 am Spoke with Modena Nunnery Patient aware.

## 2015-12-28 NOTE — Progress Notes (Signed)
[facility] OFFICE PROGRESS NOTE   Diagnosis: Non-small cell lung cancer  INTERVAL HISTORY:   Taylor Sanchez returns as scheduled. He reports feeling well. He continues crizotinib. No rash or diarrhea. He has transient blurring of the vision when he first wakes up. He continued to have pain and numbness in the right leg when he stands. No difficulty with bowel or bladder function. He completed SRS to brain lesions on 12/12/2015.  He continues physical therapy. He has returned to living in [place]. He takes MS Contin and is no longer taking oxycodone consistently for breakthrough pain.   Objective:  Vital signs in last 24 hours:  Blood pressure 113/69, pulse 77, temperature 98.4 F (36.9 C), temperature source Oral, resp. rate 18, height 6' 1"  (1.854 m), weight 190 lb 9.6 oz (86.5 kg), SpO2 96 %.    Resp:  lungs clear bilaterally Cardio:  regular rate and rhythm GI:  no hepatosplenomegaly, nontender Vascular:  trace pitting edema at the low leg and ankle bilaterally Neurologic: The motor exam is intact in the legs and feet bilaterally, he ambulates without difficulty.  Musculoskeletal: Bony prominence at the medial left clavicle  Portacath/PICC-without erythema  Lab Results:  Lab Results  Component Value Date   WBC 3.9 (L) 12/27/2015   HGB 11.5 (L) 12/27/2015   HCT 34.5 (L) 12/27/2015   MCV 84.1 12/27/2015   PLT 326 12/27/2015   NEUTROABS 2.7 12/27/2015     Imaging:  Ct Chest W Contrast  Result Date: 12/27/2015 CLINICAL DATA:  Stage IV non-small-cell lung cancer diagnosed 5/17. Oral chemotherapy ongoing. Radiation therapy to spine and brain completed. Ex-smoker. Short of breath for 2 months. EXAM: CT CHEST, ABDOMEN, AND PELVIS WITH CONTRAST TECHNIQUE: Multidetector CT imaging of the chest, abdomen and pelvis was performed following the standard protocol during bolus administration of intravenous contrast. CONTRAST:  154m ISOVUE-300 IOPAMIDOL (ISOVUE-300)  INJECTION 61% COMPARISON:  10/10/2015. FINDINGS: CT CHEST FINDINGS Cardiovascular: Normal heart size, without pericardial effusion. No central pulmonary embolism, on this non-dedicated study. Mediastinum/Nodes: No supraclavicular adenopathy. node within the subcarinal station with extension into the azygoesophageal recess measures 10 mm on image 32/series 2 and is similar. Resolution of right infrahilar adenopathy. Prevascular adenopathy is also resolved. Lungs/Pleura: Small left pleural effusion is new. Trace right pleural thickening is similar. Mild right-sided lower lobe predominant smooth interlobular septal thickening is nonspecific. Markedly improved left lower lobe aeration with only minimal subsegmental atelectasis or scar remaining. Patent airways. Musculoskeletal: A sclerotic lesion within the left side of the T10 vertebral body is more well-defined today, measuring 2.0 cm on image 48/series 2. New sclerosis involving the medial aspect of the left ninth rib. At the site of a lytic lesion previously. Left medial clavicular pathologic fracture is new, including image 12/series 2. Progression of mild T2 superior endplate compression deformity. There is new T3 superior endplate irregularity. Slight increasing T12 inferior endplate irregularity. CT ABDOMEN PELVIS FINDINGS Hepatobiliary: Normal liver. Normal gallbladder, without biliary ductal dilatation. Pancreas: Normal, without mass or ductal dilatation. Spleen: Normal in size, without focal abnormality. Adrenals/Urinary Tract: Normal adrenal glands. Normal kidneys, without hydronephrosis. Normal urinary bladder. Stomach/Bowel: Normal stomach, without wall thickening. Colonic stool burden suggests constipation. Normal terminal ileum and appendix. Normal small bowel. Vascular/Lymphatic: Normal caliber of the aorta and branch vessels. Resolution of abdominal pelvic adenopathy. Reproductive: Normal prostate. Other: No significant free fluid. No evidence of  omental or peritoneal disease. Musculoskeletal: Interval sclerosis involving the right iliac wing lesion, including on image 101/ series 2.  This is most indicative of healing. New sclerosis within the left sacral lesion, including at 2.3 cm on image 100/ series 2. Progression of severe L3 compression deformity (presuming a L5 transitional vertebral body). Ventral canal encroachment at this level. The left-sided L4 vertebral body is progressively collapsed with ventral canal encroachment. IMPRESSION: 1. Response to therapy of extraosseous malignancy/metastasis. Improved left lower lobe aeration with improved to resolved thoracic and resolved abdominal pelvic adenopathy. 2. Extensive, widespread osseous metastasis. Many lesions are newly sclerotic, likely related to interval healing. new and progressive pathologic fractures as detailed, including ventral canal encroachment at L3 and L4. 3.  Possible constipation. 4. New small left pleural effusion. Electronically Signed   By: Abigail Miyamoto M.D.   On: 12/27/2015 16:20   Ct Abdomen Pelvis W Contrast  Result Date: 12/27/2015 CLINICAL DATA:  Stage IV non-small-cell lung cancer diagnosed 5/17. Oral chemotherapy ongoing. Radiation therapy to spine and brain completed. Ex-smoker. Short of breath for 2 months. EXAM: CT CHEST, ABDOMEN, AND PELVIS WITH CONTRAST TECHNIQUE: Multidetector CT imaging of the chest, abdomen and pelvis was performed following the standard protocol during bolus administration of intravenous contrast. CONTRAST:  116m ISOVUE-300 IOPAMIDOL (ISOVUE-300) INJECTION 61% COMPARISON:  10/10/2015. FINDINGS: CT CHEST FINDINGS Cardiovascular: Normal heart size, without pericardial effusion. No central pulmonary embolism, on this non-dedicated study. Mediastinum/Nodes: No supraclavicular adenopathy. node within the subcarinal station with extension into the azygoesophageal recess measures 10 mm on image 32/series 2 and is similar. Resolution of right infrahilar  adenopathy. Prevascular adenopathy is also resolved. Lungs/Pleura: Small left pleural effusion is new. Trace right pleural thickening is similar. Mild right-sided lower lobe predominant smooth interlobular septal thickening is nonspecific. Markedly improved left lower lobe aeration with only minimal subsegmental atelectasis or scar remaining. Patent airways. Musculoskeletal: A sclerotic lesion within the left side of the T10 vertebral body is more well-defined today, measuring 2.0 cm on image 48/series 2. New sclerosis involving the medial aspect of the left ninth rib. At the site of a lytic lesion previously. Left medial clavicular pathologic fracture is new, including image 12/series 2. Progression of mild T2 superior endplate compression deformity. There is new T3 superior endplate irregularity. Slight increasing T12 inferior endplate irregularity. CT ABDOMEN PELVIS FINDINGS Hepatobiliary: Normal liver. Normal gallbladder, without biliary ductal dilatation. Pancreas: Normal, without mass or ductal dilatation. Spleen: Normal in size, without focal abnormality. Adrenals/Urinary Tract: Normal adrenal glands. Normal kidneys, without hydronephrosis. Normal urinary bladder. Stomach/Bowel: Normal stomach, without wall thickening. Colonic stool burden suggests constipation. Normal terminal ileum and appendix. Normal small bowel. Vascular/Lymphatic: Normal caliber of the aorta and branch vessels. Resolution of abdominal pelvic adenopathy. Reproductive: Normal prostate. Other: No significant free fluid. No evidence of omental or peritoneal disease. Musculoskeletal: Interval sclerosis involving the right iliac wing lesion, including on image 101/ series 2. This is most indicative of healing. New sclerosis within the left sacral lesion, including at 2.3 cm on image 100/ series 2. Progression of severe L3 compression deformity (presuming a L5 transitional vertebral body). Ventral canal encroachment at this level. The  left-sided L4 vertebral body is progressively collapsed with ventral canal encroachment. IMPRESSION: 1. Response to therapy of extraosseous malignancy/metastasis. Improved left lower lobe aeration with improved to resolved thoracic and resolved abdominal pelvic adenopathy. 2. Extensive, widespread osseous metastasis. Many lesions are newly sclerotic, likely related to interval healing. new and progressive pathologic fractures as detailed, including ventral canal encroachment at L3 and L4. 3.  Possible constipation. 4. New small left pleural effusion. Electronically Signed  By: Abigail Miyamoto M.D.   On: 12/27/2015 16:20  CT images reviewed with Taylor Sanchez  Medications: I have reviewed the patient's current medications.  Assessment/Plan: 1. Stage IV non-small cell lung cancer  MRI lumbar spine 10/06/2015, CT scans 10/10/2015 reveal multiple destructive bone lesions, chest lymphadenopathy, lung nodules/masses  CT-guided biopsy of L4 lesion 10/11/2015 confirmed poorly differentiated adenocarcinoma  Foundation 1 testing confirmed a ROS1 rearrangement Intron 23, negative for EGFR and ALK alterations  Palliative radiation to the lumbar spine initiated 10/12/2015, completed 10/25/2015  Initiation of crizotinib 10/31/2015  MRI brain 11/18/2015-6 millimeter right frontal and 3 mm left frontal metastases  SRS to brain lesions on 12/12/2015   Restaging CTs 12/27/2015-improvement in chest/abdominal lymphadenopathy, improved lung lesions, sclerosis of bone lesions, progressive compression fractures at the thoracic and lumbar spine  3. Probable postobstructive pneumonia in the left lower lobe on CT 10/10/2015, resolved on CT 12/27/2015  4. Small left lower right lower lobe pulmonary embolism on CT 10/10/2015, right leg DVT on a Doppler 10/12/2015-heparin initiated 10/10/2015, switched to Xarelto 10/13/2015 5. Zometa therapy initiated 12/28/2015     Disposition: Taylor Sanchez has been maintained on  Crizotinib for the past 2 months. His performance status has improved. The restaging CTs reveal evidence of a response to therapy. I suspect the sclerotic bone lesions indicate healing. The compression fractures at the thoracic and lumbar spine may have occurred at any time over the past few months. I will refer him to neurosurgery to consider treatment options for the severe lumbar compression fracture with radicular pain/numbness. He will contact us for bowel/bladder dysfunction.  His pain is much improved. He will taper the MS Contin to off over the next few weeks.  We discussed the indication for biphosphonate therapy. We reviewed the potential side effects associated with Zometa. He agrees to proceed. He will receive a first dose of Zometa today.  Taylor Sanchez will continue crizotinib. He will return for an office and lab visit in 4 weeks.         Betsy Coder, MD  12/28/2015  11:18 AM

## 2015-12-29 ENCOUNTER — Telehealth: Payer: Self-pay | Admitting: *Deleted

## 2015-12-29 NOTE — Telephone Encounter (Signed)
Left message on voicemail for pt to call office. Has pain recurred? Any difficulty swallowing or keeping fluids down?

## 2016-01-02 ENCOUNTER — Other Ambulatory Visit: Payer: Self-pay | Admitting: Nurse Practitioner

## 2016-01-05 ENCOUNTER — Telehealth: Payer: Self-pay | Admitting: *Deleted

## 2016-01-05 DIAGNOSIS — C801 Malignant (primary) neoplasm, unspecified: Secondary | ICD-10-CM

## 2016-01-05 DIAGNOSIS — C3490 Malignant neoplasm of unspecified part of unspecified bronchus or lung: Secondary | ICD-10-CM

## 2016-01-05 DIAGNOSIS — C7949 Secondary malignant neoplasm of other parts of nervous system: Secondary | ICD-10-CM

## 2016-01-05 DIAGNOSIS — C7951 Secondary malignant neoplasm of bone: Secondary | ICD-10-CM

## 2016-01-05 DIAGNOSIS — D492 Neoplasm of unspecified behavior of bone, soft tissue, and skin: Secondary | ICD-10-CM

## 2016-01-05 MED ORDER — MORPHINE SULFATE ER 30 MG PO TBCR: EXTENDED_RELEASE_TABLET | ORAL | 0 refills | Status: AC

## 2016-01-05 NOTE — Telephone Encounter (Signed)
Message received from patient stating that he is currently tapering off of his MS Contin and is having an increase in pain and would like to know what he should do next regarding pain.  Dr. Benay Spice notified and order received for pt to take MS Contin 30 mg daily.  Pt notified and appreciative of call.

## 2016-01-11 ENCOUNTER — Telehealth: Payer: Self-pay

## 2016-01-11 NOTE — Telephone Encounter (Signed)
Received addendum to tx plan from [facility] requesting MD signature. Dr. Benay Spice reviewed and signed, faxed back to [place].

## 2016-01-20 ENCOUNTER — Telehealth: Payer: Self-pay | Admitting: *Deleted

## 2016-01-20 NOTE — Telephone Encounter (Signed)
Received call from [facility] RN/After Hours Nurse stating that pt is coughing & has mild SOB & wants to know what he can take.  She didn't want to recommend Robitussin b/c "it can increase S.E. Of morphine".  She thought the pt should come it but he states he is going to work & has an appt on Monday & just wants to know what he can take.  Call back # is [phone].  Message to Dr Gearldine Shown RN, Lorriane Shire.

## 2016-01-20 NOTE — Telephone Encounter (Signed)
Per Dr. Benay Spice, patient notified that he may take OTC Robitussin for cough.  Pt denies any pain, nausea, fevers or chills at this time.  Pt appreciative of call back and voices understanding to call office with further questions or concerns.

## 2016-01-23 ENCOUNTER — Telehealth: Payer: Self-pay | Admitting: Nurse Practitioner

## 2016-01-23 ENCOUNTER — Telehealth: Payer: Self-pay | Admitting: *Deleted

## 2016-01-23 ENCOUNTER — Ambulatory Visit: Payer: BC Managed Care – PPO

## 2016-01-23 ENCOUNTER — Ambulatory Visit (HOSPITAL_BASED_OUTPATIENT_CLINIC_OR_DEPARTMENT_OTHER): Payer: BC Managed Care – PPO | Admitting: Nurse Practitioner

## 2016-01-23 ENCOUNTER — Ambulatory Visit
Admission: RE | Admit: 2016-01-23 | Payer: BC Managed Care – PPO | Source: Ambulatory Visit | Admitting: Radiation Oncology

## 2016-01-23 ENCOUNTER — Ambulatory Visit (HOSPITAL_COMMUNITY)
Admission: RE | Admit: 2016-01-23 | Discharge: 2016-01-23 | Disposition: A | Discharge status: Home / Self Care | Payer: BC Managed Care – PPO | Source: Ambulatory Visit | Attending: Nurse Practitioner | Admitting: Nurse Practitioner

## 2016-01-23 VITALS — BP 126/76 | HR 92 | Temp 98.8°F | Resp 18 | Wt 189.7 lb

## 2016-01-23 DIAGNOSIS — C3490 Malignant neoplasm of unspecified part of unspecified bronchus or lung: Secondary | ICD-10-CM

## 2016-01-23 DIAGNOSIS — J189 Pneumonia, unspecified organism: Secondary | ICD-10-CM | POA: Diagnosis not present

## 2016-01-23 DIAGNOSIS — J90 Pleural effusion, not elsewhere classified: Secondary | ICD-10-CM | POA: Diagnosis not present

## 2016-01-23 DIAGNOSIS — C7951 Secondary malignant neoplasm of bone: Secondary | ICD-10-CM | POA: Diagnosis not present

## 2016-01-23 DIAGNOSIS — I2699 Other pulmonary embolism without acute cor pulmonale: Secondary | ICD-10-CM

## 2016-01-23 DIAGNOSIS — R05 Cough: Secondary | ICD-10-CM | POA: Insufficient documentation

## 2016-01-23 DIAGNOSIS — C7931 Secondary malignant neoplasm of brain: Secondary | ICD-10-CM | POA: Diagnosis not present

## 2016-01-23 MED ORDER — LEVOFLOXACIN 750 MG PO TABS: 750.0000 mg | ORAL_TABLET | Freq: Every day | ORAL | 0 refills | Status: AC

## 2016-01-23 NOTE — Telephone Encounter (Signed)
TC from patient stating that he has had a cough since last week and some shortness of breath. He has tried Robitussin as of Friday w/o relief. He went to Urgent Care over the weekend and was prescribed Tessalon Pearles and Doxycycline. He states he is still coughing quite a lot and feels more short of breath. He did not have a chest xray done at Urgent care. He states his secretions  Range from white to green to brownish.  Denies fever or chills.  Spoke with Lavella Lemons, RN for Dr. Benay Spice.  Per Dr. Benay Spice, patient is to come in this morning to see Ned Card, NP at around 11:15 am.  TC  Back to patient and advised him of the above. Pt lives in [place] but will get here as soon as he can.

## 2016-01-23 NOTE — Telephone Encounter (Signed)
Avs report and appt schd given per 01/23/16 los.

## 2016-01-23 NOTE — Progress Notes (Addendum)
[facility] OFFICE PROGRESS NOTE   Diagnosis:  Non-small cell lung cancer  INTERVAL HISTORY:   Taylor Sanchez returns prior to scheduled follow-up for evaluation of a cough and shortness of breath. He developed a cough mid last week. He noted shortness of breath 01/20/2016. He was seen at an urgent care interim on 01/22/2016. He was started on doxycycline and Tessalon Perles. He is taking oxycodone to aid with sleep. He denies fever. Maximum temperature 99 degrees. No shaking chills. The cough is mainly productive and ranges in color from clear to green to brown. No bright red blood. He denies leg swelling and calf pain. The shortness of breath worsens when he lays flat. He reports leg numbness has increased  Objective:  Vital signs in last 24 hours:  Blood pressure 126/76, pulse 92, temperature 98.8 F (37.1 C), temperature source Oral, resp. rate 18, weight 189 lb 11.2 oz (86 kg), SpO2 97 %.    HEENT: No thrush or ulcers. Resp: Breath sounds diminished left lower lung field, scattered wheezes. No respiratory distress. Cardio: Regular rate and rhythm. No JVD. GI: Abdomen soft and nontender. No hepatomegaly. Vascular: No leg edema. Neurologic: The leg strength is intact aside from slight weakness versus guarding with proximal flexion at the right hip   Lab Results:  Lab Results  Component Value Date   WBC 3.9 (L) 12/27/2015   HGB 11.5 (L) 12/27/2015   HCT 34.5 (L) 12/27/2015   MCV 84.1 12/27/2015   PLT 326 12/27/2015   NEUTROABS 2.7 12/27/2015    Imaging:  No results found.  Medications: I have reviewed the patient's current medications.  Assessment/Plan: 1. Stage IV non-small cell lung cancer  MRI lumbar spine 10/06/2015, CT scans 10/10/2015 reveal multiple destructive bone lesions, chest lymphadenopathy, lung nodules/masses  CT-guided biopsy of L4 lesion 10/11/2015 confirmed poorly differentiated adenocarcinoma  Foundation 1 testing confirmed a ROS1  rearrangement Intron 23, negative for EGFR and ALK alterations  Palliative radiation to the lumbar spine initiated 10/12/2015, completed 10/25/2015  Initiation of crizotinib 10/31/2015  MRI brain 11/18/2015-6 millimeter right frontal and 3 mm left frontal metastases  SRS to brain lesions on 12/12/2015   Restaging CTs 12/27/2015-improvement in chest/abdominal lymphadenopathy, improved lung lesions, sclerosis of bone lesions, progressive compression fractures at the thoracic and lumbar spine  3. Probable postobstructive pneumonia in the left lower lobe on CT 10/10/2015, resolved on CT 12/27/2015  4. Small left lower right lower lobe pulmonary embolism on CT 10/10/2015, right leg DVT on a Doppler 10/12/2015-heparin initiated 10/10/2015, switched to Xarelto 10/13/2015 5. Zometa therapy initiated 12/28/2015    Disposition: Taylor Sanchez with a cough and shortness of breath, progressive over the past 5 days. He was seen in urgent care yesterday and started on a course of doxycycline. We are referring him for a stat chest x-ray. He will return to the office after the x-ray has been completed.  The chest x-ray showed a moderate left pleural effusion, small right pleural effusion and bilateral infiltrates concerning for pneumonia. We also discussed the possibility his symptoms are related to crizotinib or progression of the lung cancer. He will submit a sputum sample for Gram stain and culture. He will begin a five-day course of Levaquin. We are placing crizotinib on hold.   He will return for a follow-up visit on 01/31/2016. He understands to contact the office in the interim with any problems. We specifically discussed worsening of his current symptoms.  Patient seen with Dr. Benay Spice. 25 minutes were spent  face-to-face at today's visit with the majority of that time involved in counseling/coordination of care.  Ned Card ANP/GNP-BC   01/23/2016  11:57 AM  This was a shared visit with Ned Card. Taylor Sanchez was interviewed and examined. We reviewed the chest x-ray images. He will be treated for pneumonia based on his symptoms and appearance of the chest x-ray. Crizotinib will be placed on hold until his symptoms improve.  Julieanne Manson, M.D.

## 2016-01-25 ENCOUNTER — Other Ambulatory Visit: Payer: BC Managed Care – PPO

## 2016-01-25 ENCOUNTER — Telehealth: Payer: Self-pay | Admitting: *Deleted

## 2016-01-25 ENCOUNTER — Ambulatory Visit: Payer: BC Managed Care – PPO | Admitting: Oncology

## 2016-01-25 ENCOUNTER — Ambulatory Visit
Admission: RE | Admit: 2016-01-25 | Discharge: 2016-01-25 | Disposition: A | Discharge status: Home / Self Care | Payer: BC Managed Care – PPO | Source: Ambulatory Visit | Attending: Radiation Oncology | Admitting: Radiation Oncology

## 2016-01-25 ENCOUNTER — Ambulatory Visit: Payer: BC Managed Care – PPO | Admitting: Nurse Practitioner

## 2016-01-25 NOTE — Telephone Encounter (Signed)
Called patient with instructions to go to the nearest ED due to symptoms and transportation problem.  Asked if he should go to [facility].  Advised whichever ED is closer and the ED team can call [facility] for the on-call provider if they have any questions.

## 2016-01-25 NOTE — Telephone Encounter (Signed)
"  I was diagnosed with pneumonia a few days ago and my symptoms are getting worse.  Today is day three of antibiotic and I use an inhaler every four hours.  I can't sleep because of back injury and the pneumonia so I'm going crazy.  I am short of breath really bad.  Temp today = 98.8.  Cough producing white, clear mucus.  Appetite not good."    Patient was seen on 01-23-2016 Verbal order received and read back from Ned Card NP for patient to come in to see Dr. Benay Spice or go to ED.   Notified patient who "does not feel he needs to go to the [facility] ED, will come to [facility].  I'll call my mom to bring me."  Scheduling message sent.

## 2016-01-26 LAB — GRAM STAIN W/SPUTUM CULT RFLX

## 2016-01-26 NOTE — Telephone Encounter (Signed)
Noted in EMR pt was admitted to [facility] via ED on 9/13. Left message with [facility] for ICU MD/ resident to call/ page Dr. Benay Spice today.

## 2016-01-30 ENCOUNTER — Telehealth: Payer: Self-pay | Admitting: Radiation Oncology

## 2016-01-30 NOTE — Telephone Encounter (Signed)
Mother, Taylor Sanchez, called to cancel all future appointments for her son per the patient is now deceased. Would like to avoid future phone calls and reminders.

## 2016-01-31 ENCOUNTER — Ambulatory Visit: Payer: BC Managed Care – PPO | Admitting: Nurse Practitioner

## 2016-01-31 ENCOUNTER — Other Ambulatory Visit: Payer: BC Managed Care – PPO

## 2016-01-31 ENCOUNTER — Ambulatory Visit: Payer: BC Managed Care – PPO | Admitting: Radiation Oncology

## 2016-02-01 ENCOUNTER — Encounter: Payer: Self-pay | Admitting: Radiation Therapy

## 2016-02-01 NOTE — Progress Notes (Signed)
Pt expired.

## 2016-02-12 DEATH — deceased

## 2016-04-21 ENCOUNTER — Other Ambulatory Visit: Payer: Self-pay | Admitting: Nurse Practitioner

## 2016-09-30 IMAGING — CT CT CHEST W/ CM
2 of 4 series · 15 of 46 positions shown, 17 images · IV contrast (iopamidol)
Comparison: 10/10/2015.

CLINICAL DATA: Stage IV non-small-cell lung cancer diagnosed [DATE].
Oral chemotherapy ongoing. Radiation therapy to spine and brain
completed. Ex-smoker. Short of breath for 2 months.

EXAM:
CT CHEST, ABDOMEN, AND PELVIS WITH CONTRAST
TECHNIQUE: Multidetector CT imaging of the chest, abdomen and pelvis was
performed following the standard protocol during bolus
administration of intravenous contrast.
CONTRAST:  100mL [id number] IOPAMIDOL (AS6XAV-SKK) INJECTION 61%

[Series 2: cap with st · axial · 0.80mm/px · z∈[-436,+124]mm · 12 of 134 slices shown, 14 images]
[im 11/134  soft-tissue]
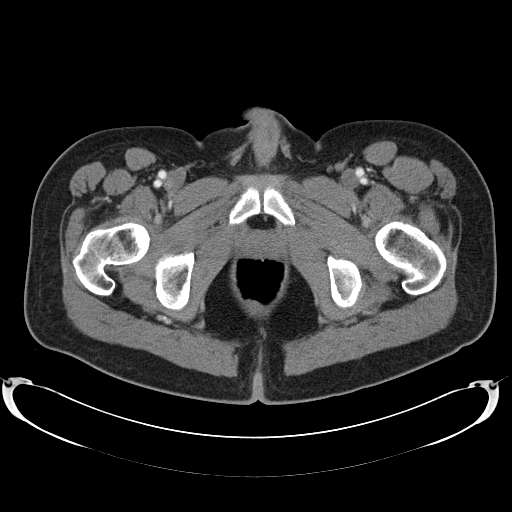
[im 11/134  bone]
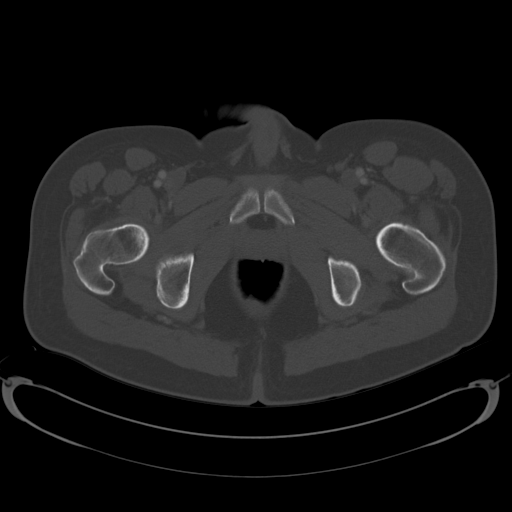
[im 21/134  soft-tissue]
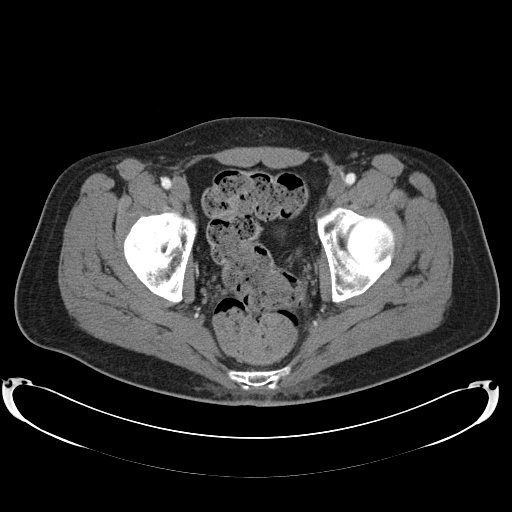
[im 31/134  soft-tissue]
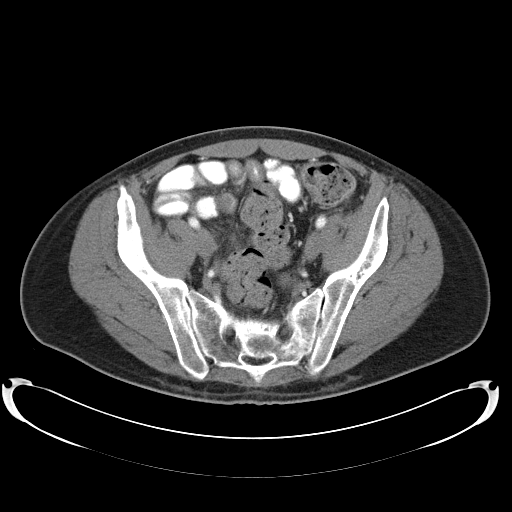
[im 41/134  soft-tissue]
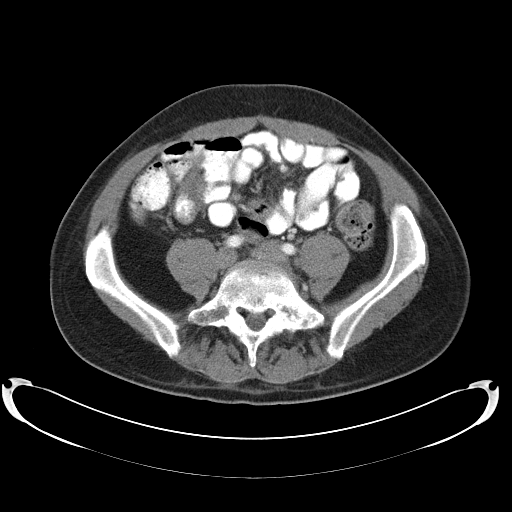
[im 52/134  soft-tissue]
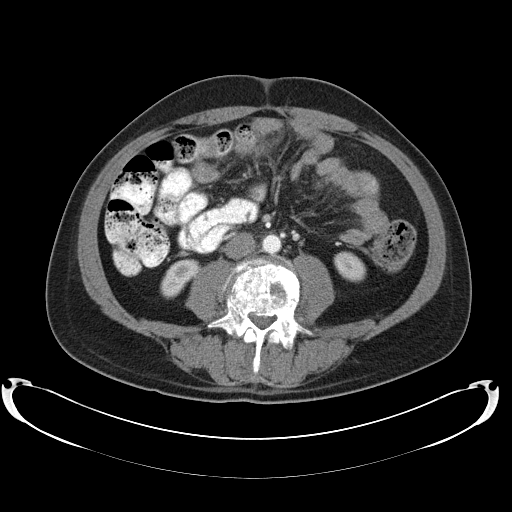
[im 62/134  soft-tissue]
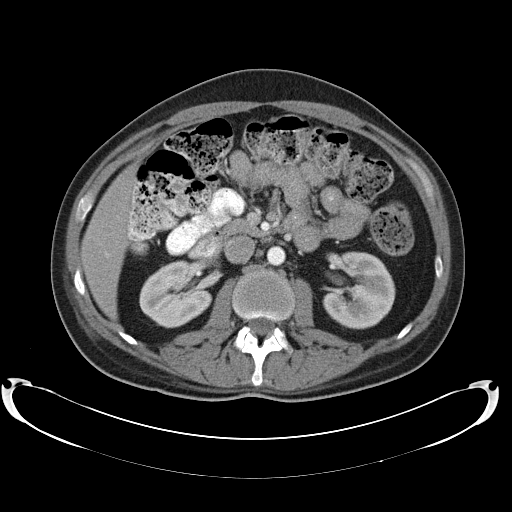
[im 72/134  soft-tissue]
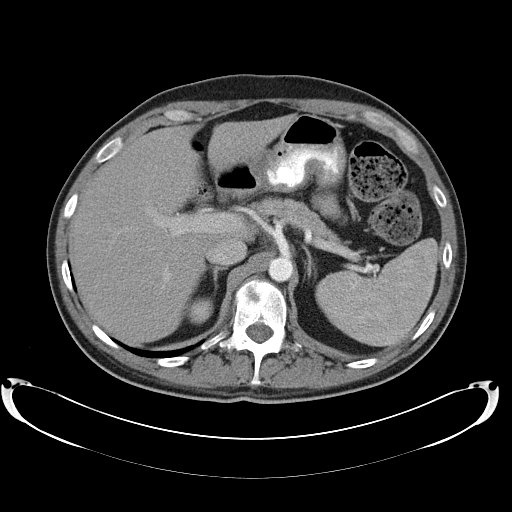
[im 82/134  soft-tissue]
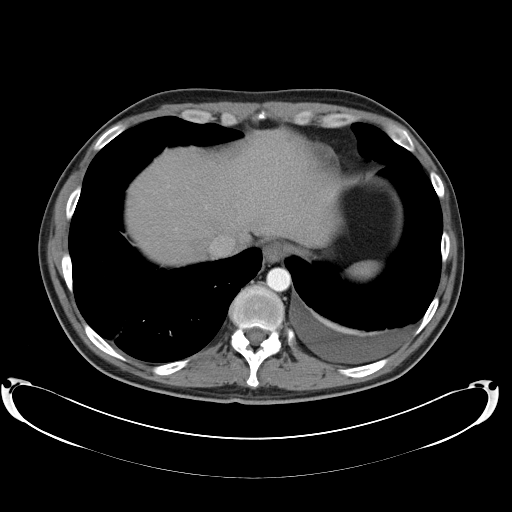
[im 93/134  soft-tissue]
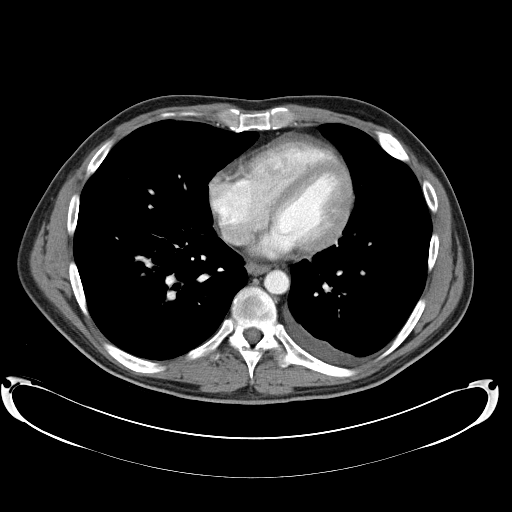
[im 93/134  bone]
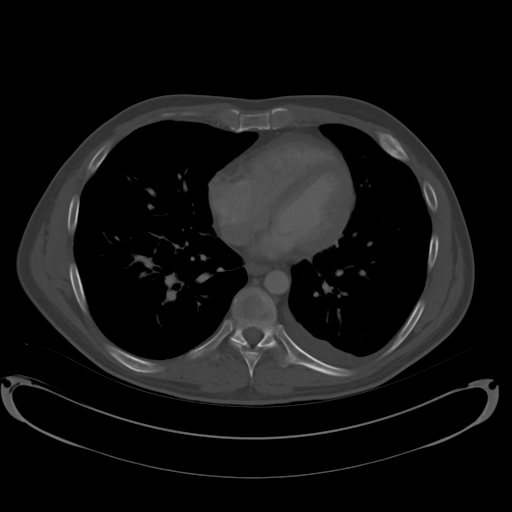
[im 103/134  soft-tissue]
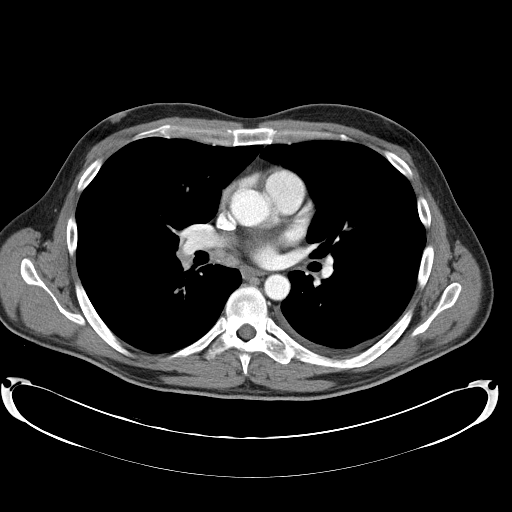
[im 113/134  soft-tissue]
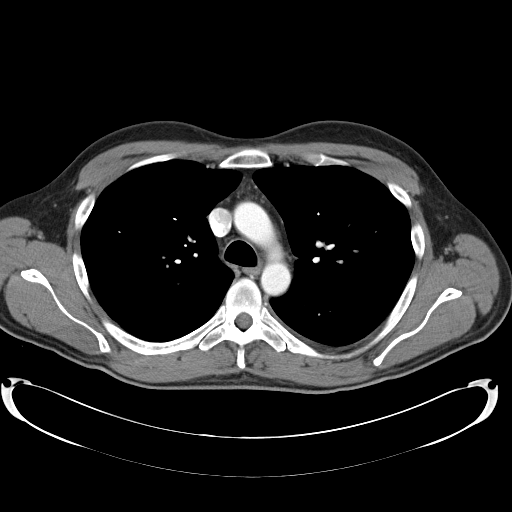
[im 123/134  soft-tissue]
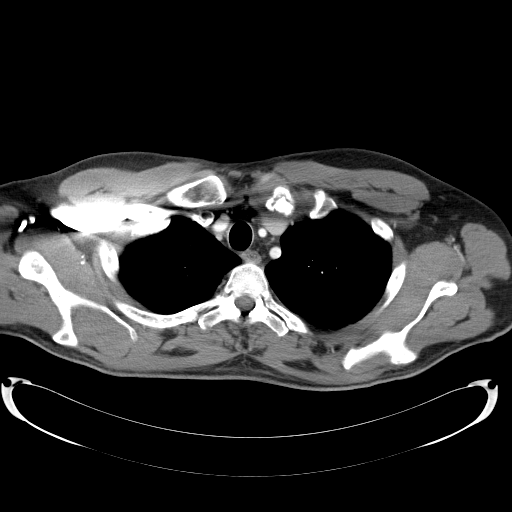

[Series 602: <mpr thick range> · coronal · 1.30mm/px · 3 of 136 slices shown]
[im 46/136  soft-tissue]
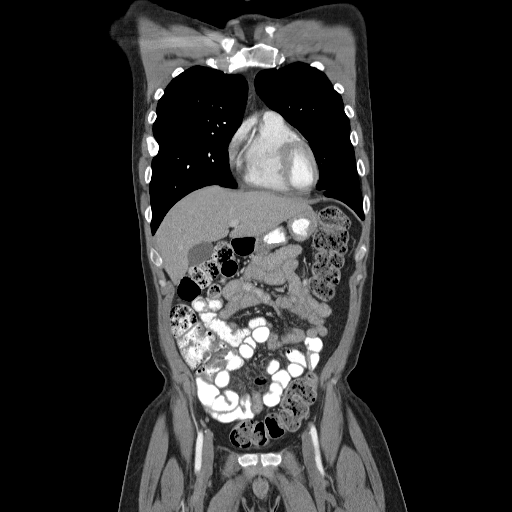
[im 61/136  soft-tissue]
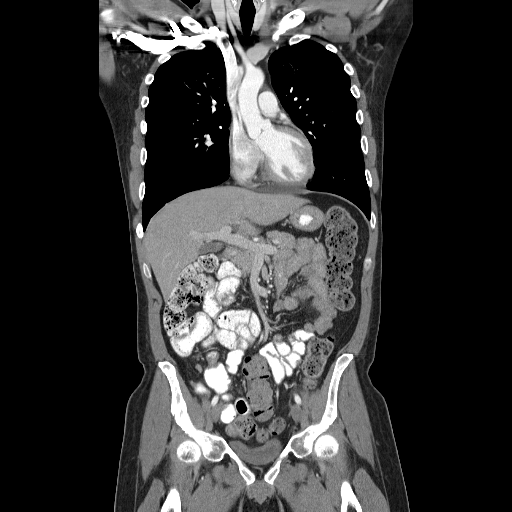
[im 76/136  soft-tissue]
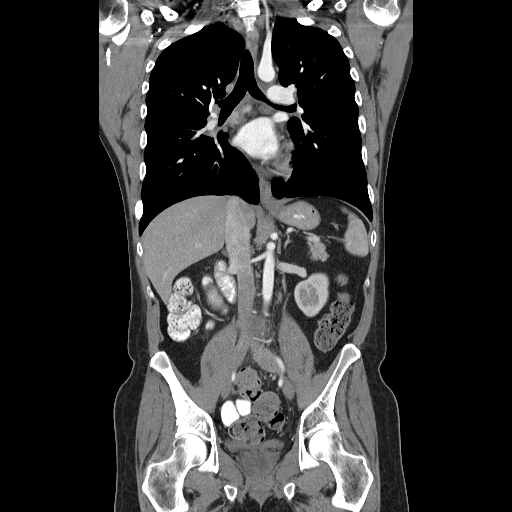

[15 of 46 positions shown; findings below may reference images not displayed]

FINDINGS: CT CHEST FINDINGS

Cardiovascular: Normal heart size, without pericardial effusion. No
central pulmonary embolism, on this non-dedicated study.

Mediastinum/Nodes: No supraclavicular adenopathy. node within the
subcarinal station with extension into the azygoesophageal recess
measures 10 mm on image 32/series 2 and is similar.

Resolution of right infrahilar adenopathy. Prevascular adenopathy is
also resolved.

Lungs/Pleura: Small left pleural effusion is new. Trace right
pleural thickening is similar.

Mild right-sided lower lobe predominant smooth interlobular septal
thickening is nonspecific. Markedly improved left lower lobe
aeration with only minimal subsegmental atelectasis or scar
remaining. Patent airways.

Musculoskeletal: A sclerotic lesion within the left side of the T10
vertebral body is more well-defined today, measuring 2.0 cm on image
48/series 2. New sclerosis involving the medial aspect of the left
ninth rib. At the site of a lytic lesion previously.

Left medial clavicular pathologic fracture is new, including image
12/series 2.

Progression of mild T2 superior endplate compression deformity.
There is new T3 superior endplate irregularity. Slight increasing
T12 inferior endplate irregularity.

CT ABDOMEN PELVIS FINDINGS

Hepatobiliary: Normal liver. Normal gallbladder, without biliary
ductal dilatation.

Pancreas: Normal, without mass or ductal dilatation.

Spleen: Normal in size, without focal abnormality.

Adrenals/Urinary Tract: Normal adrenal glands. Normal kidneys,
without hydronephrosis. Normal urinary bladder.

Stomach/Bowel: Normal stomach, without wall thickening. Colonic
stool burden suggests constipation. Normal terminal ileum and
appendix. Normal small bowel.

Vascular/Lymphatic: Normal caliber of the aorta and branch vessels.
Resolution of abdominal pelvic adenopathy.

Reproductive: Normal prostate.

Other: No significant free fluid. No evidence of omental or
peritoneal disease.

Musculoskeletal: Interval sclerosis involving the right iliac wing
lesion, including on image 101/ series 2. This is most indicative of
healing.

New sclerosis within the left sacral lesion, including at 2.3 cm on
image 100/ series 2.

Progression of severe L3 compression deformity (presuming a L5
transitional vertebral body). Ventral canal encroachment at this
level. The left-sided L4 vertebral body is progressively collapsed
with ventral canal encroachment.
IMPRESSION: 1. Response to therapy of extraosseous malignancy/metastasis.
Improved left lower lobe aeration with improved to resolved thoracic
and resolved abdominal pelvic adenopathy.
2. Extensive, widespread osseous metastasis. Many lesions are newly
sclerotic, likely related to interval healing. new and progressive
pathologic fractures as detailed, including ventral canal
encroachment at L3 and L4.
3.  Possible constipation.
4. New small left pleural effusion.
